# Patient Record
Sex: Female | Born: 2000 | Hispanic: Yes | Marital: Single | Smoking: Former smoker
Health system: Southern US, Community
[De-identification: ages and names within clinical notes are randomized; demographics above are authoritative.]

## PROBLEM LIST (undated history)

## (undated) DIAGNOSIS — Z789 Other specified health status: Secondary | ICD-10-CM

## (undated) DIAGNOSIS — Z8759 Personal history of other complications of pregnancy, childbirth and the puerperium: Secondary | ICD-10-CM

## (undated) DIAGNOSIS — R112 Nausea with vomiting, unspecified: Secondary | ICD-10-CM

## (undated) HISTORY — DX: Personal history of other complications of pregnancy, childbirth and the puerperium: Z87.59

## (undated) HISTORY — PX: OTHER SURGICAL HISTORY: SHX169

## (undated) HISTORY — PX: WISDOM TOOTH EXTRACTION: SHX21

## (undated) HISTORY — DX: Other specified health status: Z78.9

## (undated) HISTORY — DX: Nausea with vomiting, unspecified: R11.2

---
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
## 2020-10-26 ENCOUNTER — Other Ambulatory Visit: Payer: Self-pay

## 2020-10-26 ENCOUNTER — Ambulatory Visit (LOCAL_COMMUNITY_HEALTH_CENTER): Payer: Medicaid Other

## 2020-10-26 VITALS — BP 108/73 | Ht 61.0 in | Wt 118.0 lb

## 2020-10-26 DIAGNOSIS — Z3201 Encounter for pregnancy test, result positive: Secondary | ICD-10-CM

## 2020-10-26 LAB — PREGNANCY, URINE: Preg Test, Ur: POSITIVE — AB

## 2020-10-26 MED ORDER — PRENATAL 27-0.8 MG PO TABS
1.0000 | ORAL_TABLET | Freq: Every day | ORAL | 0 refills | Status: AC
Start: 2020-10-26 — End: 2021-02-03

## 2020-10-26 NOTE — Progress Notes (Addendum)
UPT positive. Consult Hazle Coca, CNM regarding hx preterm still birth C-Section delivery at 5 month gestation d/t low amniotic fluid. Provider advises to establish prenatal care ASAP.   Plans prenatal care at [facility]. Provider reports pt may begin prenatal care at [facility], but may need to be referred if becomes high risk.  To clerk for preadmit.Dawn Shepherd, RN   Consulted on the plan of care for this client.  I agree with the documented note and actions taken to provide care for this client.  Hazle Coca, CNM

## 2020-11-05 ENCOUNTER — Ambulatory Visit: Payer: Medicaid Other | Admitting: Family Medicine

## 2020-11-05 ENCOUNTER — Encounter: Payer: Self-pay | Admitting: Family Medicine

## 2020-11-05 ENCOUNTER — Other Ambulatory Visit: Payer: Self-pay

## 2020-11-05 VITALS — BP 107/76 | HR 106 | Temp 98.6°F | Wt 120.4 lb

## 2020-11-05 DIAGNOSIS — O34219 Maternal care for unspecified type scar from previous cesarean delivery: Secondary | ICD-10-CM

## 2020-11-05 DIAGNOSIS — Z23 Encounter for immunization: Secondary | ICD-10-CM

## 2020-11-05 DIAGNOSIS — O0990 Supervision of high risk pregnancy, unspecified, unspecified trimester: Secondary | ICD-10-CM | POA: Diagnosis not present

## 2020-11-05 DIAGNOSIS — O09899 Supervision of other high risk pregnancies, unspecified trimester: Secondary | ICD-10-CM | POA: Insufficient documentation

## 2020-11-05 LAB — HEMOGLOBIN, FINGERSTICK: Hemoglobin: 12.7 g/dL (ref 11.1–15.9)

## 2020-11-05 LAB — URINALYSIS
Bilirubin, UA: NEGATIVE
Glucose, UA: NEGATIVE
Ketones, UA: NEGATIVE
Nitrite, UA: POSITIVE — AB
Protein,UA: NEGATIVE
Specific Gravity, UA: 1.03 (ref 1.005–1.030)
Urobilinogen, Ur: 0.2 mg/dL (ref 0.2–1.0)
pH, UA: 6 (ref 5.0–7.5)

## 2020-11-05 MED ORDER — HYDROXYPROGESTERONE CAPROATE 275 MG/1.1ML ~~LOC~~ SOAJ: 275.0000 mg | SUBCUTANEOUS | Status: DC

## 2020-11-05 NOTE — Progress Notes (Signed)
[facility] HEALTH DEPT [facility] [address] [place] [address] [phone]  INITIAL PRENATAL VISIT NOTE  Subjective:  Dawn Schaefer is a 20 y.o. G2P0100 at [redacted]w[redacted]d being seen today to start prenatal care at the [facility] Department.  She is currently monitored for the following issues for this high-risk pregnancy and has Supervision of high risk pregnancy, antepartum; History of cesarean delivery, currently pregnant; and H/O preterm delivery, currently pregnant on their problem list.  Patient reports no complaints.  Contractions: Not present. Vag. Bleeding: None.  Movement: Absent. Denies leaking of fluid.   This is desired pregnancy and FOB involved and excited. The patient reports having bleeding at 5 months of pregnancy and delivered by C-section due to "no fluid" around the baby. She has a horizontal skin incision and does not remember being told if she can labor or not. Infant died several days after delivery.   Indications for ASA therapy (per uptodate) One of the following: Previous pregnancy with preeclampsia, especially early onset and with an adverse outcome No Multifetal gestation No Chronic hypertension No Type 1 or 2 diabetes mellitus No Chronic kidney disease No Autoimmune disease (antiphospholipid syndrome, systemic lupus erythematosus) No  Two or more of the following: Nulliparity No Obesity (body mass index >30 kg/m2) No Family history of preeclampsia in mother or sister No Age ?35 years No Sociodemographic characteristics (African American race, low socioeconomic level) No Personal risk factors (eg, previous pregnancy with low birth weight or small for gestational age infant, previous adverse pregnancy outcome [eg, stillbirth], interval >10 years between pregnancies) No   The following portions of the patient's history were reviewed and updated as appropriate: allergies, current  medications, past family history, past medical history, past social history, past surgical history and problem list. Problem list updated.  Objective:   Vitals:   11/05/20 1342  BP: 107/76  Pulse: (!) 106  Temp: 98.6 F (37 C)  Weight: 120 lb 6.4 oz (54.6 kg)    Fetal Status: Fetal Heart Rate (bpm): 154 Fundal Height: 14 cm Movement: Absent     Physical Exam Vitals and nursing note reviewed.  Constitutional:      General: She is not in acute distress.    Appearance: Normal appearance. She is well-developed.  HENT:     Head: Normocephalic and atraumatic.     Right Ear: External ear normal.     Left Ear: External ear normal.     Nose: Nose normal. No congestion or rhinorrhea.     Mouth/Throat:     Lips: Pink.     Mouth: Mucous membranes are moist.     Dentition: Normal dentition. No dental caries.     Pharynx: Oropharynx is clear. Uvula midline.     Comments: Dentition: normal, no cavities.  Eyes:     General: No scleral icterus.    Conjunctiva/sclera: Conjunctivae normal.  Neck:     Thyroid: No thyroid mass or thyromegaly.  Cardiovascular:     Rate and Rhythm: Normal rate.     Pulses: Normal pulses.     Comments: Extremities are warm and well perfused Pulmonary:     Effort: Pulmonary effort is normal.     Breath sounds: Normal breath sounds.  Chest:     Chest wall: No mass.  Breasts:     Tanner Score is 5. Breasts are symmetrical.     Right: Normal. No mass, nipple discharge, skin change or axillary adenopathy.  Left: Normal. No mass, nipple discharge, skin change or axillary adenopathy.    Abdominal:     General: Abdomen is flat.     Palpations: Abdomen is soft.     Tenderness: There is no abdominal tenderness.     Comments: Gravid   Genitourinary:    General: Normal vulva.     Exam position: Lithotomy position.     Pubic Area: No rash.      Labia:        Right: No rash.        Left: No rash.      Vagina: Normal. No vaginal discharge.     Cervix: No  cervical motion tenderness or friability.     Uterus: Normal. Enlarged (Gravid 14wk). Not tender.      Adnexa: Right adnexa normal and left adnexa normal.     Rectum: Normal. No external hemorrhoid.  Musculoskeletal:     Right lower leg: No edema.     Left lower leg: No edema.  Lymphadenopathy:     Cervical: No cervical adenopathy.     Upper Body:     Right upper body: No axillary adenopathy.     Left upper body: No axillary adenopathy.  Skin:    General: Skin is warm.     Capillary Refill: Capillary refill takes less than 2 seconds.  Neurological:     Mental Status: She is alert.     Assessment and Plan:  Pregnancy: G2P0100 at [redacted]w[redacted]d  1. Supervision of high risk pregnancy, antepartum Reviewed pregnancy care at [facility] Discussed cadence of visits Desires genetic screening-- referral to genetic counseling for NIPT. Too late for FIRST trimester screening. Desires Quad Has dental home-- [facility] dental clinic. Last appt 2 month ago - Prenatal profile without Varicella/Rubella (267124) - Lead, blood (adult age 65 yrs or greater) - HIV-1/HIV-2 Qualitative RNA - HCV Ab w Reflex to Quant PCR - Urine Culture - Chlamydia/GC NAA, Confirmation - Hemoglobinopathy evaluation -[id number] - [place] MFM OB COMP + 14 WK; Future - HYDROXYprogesterone caproate [facility]) autoinjector; Inject 275 mg into the skin every 7 (seven) days. Weekly injections for 16-36 weeks.  Dispense: 1.05 mL - Hemoglobin, venipuncture - Urinalysis (Urine Dip)  2. H/O preterm delivery, currently pregnant - Discussed 46 with patient given likely preterm birth, she says 5 months but the fact that a CS was done I suspect she was though to be at least 24 weeks since a CS on a previable fetus would be non-standard of care.  She agrees to 17P weekly - [place] MFM OB COMP + 14 WK; Future - HYDROXYprogesterone caproate [facility]) autoinjector; Inject 275 mg into the skin every 7 (seven) days. Weekly injections for 16-36 weeks.  Dispense: 1.05  mL  3. History of cesarean delivery, currently pregnant Unsure of whether this was LTCS or vertical/classical. Given GA of delivery there is a much higher likelihood of classical CS. Patient signed ROI and will try to obtain H&P, Op report and DC summary.    Discussed overview of care and coordination with inpatient delivery practices including [facility], [facility], [facility] and [facility].   Reviewed Centering pregnancy as standard of care at [facility], oriented to room and showed video.    Preterm labor symptoms and general obstetric precautions including but not limited to vaginal bleeding, contractions, leaking of fluid and fetal movement were reviewed in detail with the patient.  Please refer to After Visit Summary for other counseling recommendations.   Return in about 4 weeks (around 12/03/2020)  for Routine prenatal care, in person.  Future Appointments  Date Time Provider Department Center  12/03/2020 10:20 AM [facility] PROVIDER [facility] None    Federico Flake, MD

## 2020-11-05 NOTE — Progress Notes (Signed)
Presents for initiation of prenatal care and taking PNV daily. Denies ED evaluation during pregnancy. Client reports born in [place] and has never traveled internationally. Accepts flu vaccine today, but declines Covid vaccine. Jossie Ng, RN  ROI for [facility] obtained for 05/2019 H & P, delivery note and C-section operative report. Jossie Ng, RN  ROI faxed with fax confirmation received. Urine dip reviewed and 1+ leukocytes / positive nitrites. Urine culture obtained today. Jossie Ng, RN

## 2020-11-06 LAB — CBC/D/PLT+RPR+RH+ABO+AB SCR
Antibody Screen: NEGATIVE
Basophils Absolute: 0.1 10*3/uL (ref 0.0–0.2)
Basos: 1 %
EOS (ABSOLUTE): 0.1 10*3/uL (ref 0.0–0.4)
Eos: 1 %
Hematocrit: 37.9 % (ref 34.0–46.6)
Hemoglobin: 13 g/dL (ref 11.1–15.9)
Hepatitis B Surface Ag: NEGATIVE
Immature Grans (Abs): 0.1 10*3/uL (ref 0.0–0.1)
Immature Granulocytes: 1 %
Lymphocytes Absolute: 1.9 10*3/uL (ref 0.7–3.1)
Lymphs: 18 %
MCH: 31.5 pg (ref 26.6–33.0)
MCHC: 34.3 g/dL (ref 31.5–35.7)
MCV: 92 fL (ref 79–97)
Monocytes Absolute: 0.7 10*3/uL (ref 0.1–0.9)
Monocytes: 6 %
Neutrophils Absolute: 7.9 10*3/uL — ABNORMAL HIGH (ref 1.4–7.0)
Neutrophils: 73 %
Platelets: 242 10*3/uL (ref 150–450)
RBC: 4.13 x10E6/uL (ref 3.77–5.28)
RDW: 13 % (ref 11.7–15.4)
RPR Ser Ql: NONREACTIVE
Rh Factor: POSITIVE
WBC: 10.7 10*3/uL (ref 3.4–10.8)

## 2020-11-06 LAB — HCV AB W REFLEX TO QUANT PCR: HCV Ab: <0.1 s/co ratio (ref 0.0–0.9)

## 2020-11-06 LAB — HCV INTERPRETATION

## 2020-11-06 LAB — LEAD, BLOOD (ADULT >= 16 YRS): Lead-Whole Blood: <1 ug/dL (ref 0–4)

## 2020-11-07 ENCOUNTER — Telehealth: Payer: Self-pay

## 2020-11-07 LAB — URINE CULTURE

## 2020-11-07 LAB — HIV-1/HIV-2 QUALITATIVE RNA
HIV-1 RNA, Qualitative: NONREACTIVE
HIV-2 RNA, Qualitative: NONREACTIVE

## 2020-11-07 LAB — HGB FRACTIONATION CASCADE
Hgb A2: 3 % (ref 1.8–3.2)
Hgb A: 97 % (ref 96.4–98.8)
Hgb F: 0 % (ref 0.0–2.0)
Hgb S: 0 %

## 2020-11-07 LAB — CHLAMYDIA/GC NAA, CONFIRMATION
Chlamydia trachomatis, NAA: NEGATIVE
Neisseria gonorrhoeae, NAA: NEGATIVE

## 2020-11-07 NOTE — Telephone Encounter (Signed)
Encounter opened in error. [name], RN  

## 2020-11-07 NOTE — Telephone Encounter (Signed)
Call to [facility] to verify [place] and genetic counseling appt scheduled. Per scheduler Britta Mccreedy, not aware of referral as does not work off a work que (like Copy). Client info given and [place] appt scheduled for 12/04/2020 at 1100. However, scheduler can't see genetic counseling referral, but RN can see it in referrals on Epic appt desk. Per Britta Mccreedy, she will talk with someone about this. RN will follow-up tomorrow if no response from [place] today.   Call to client with [place] appt of 12/04/20 at 1100 with [facility] Interpreter ID # [id number] and call will not go through. Call to emergency contact number and no available voicemail. Jossie Ng, RN

## 2020-11-08 NOTE — Telephone Encounter (Signed)
Phone call to pt with interpreter Salli Real. Received message about "calling restrictions" and unable to leave message.   Called again from different line at [facility] and got a busy signal. Unable to leave message.

## 2020-11-08 NOTE — Telephone Encounter (Signed)
[facility] [place] 12/04/2020 at 1100 with genetic counselng at 1200 (Referral for genetic counselng faxed today with confirmation received. Call made to obtain appt from [facility]). Call to client with [facility] [employer]pre[employer] ID # [id number] and per recorded message, call cannot be completed as dialed. Call to emergency contact and left message requesting assistance contacting client to call [facility] for [place] appt. Number to call provided. Jossie Ng, RN

## 2020-11-09 ENCOUNTER — Telehealth: Payer: Self-pay | Admitting: Advanced Practice Midwife

## 2020-11-09 ENCOUNTER — Telehealth: Payer: Self-pay

## 2020-11-09 ENCOUNTER — Encounter: Payer: Self-pay | Admitting: Advanced Practice Midwife

## 2020-11-09 ENCOUNTER — Other Ambulatory Visit: Payer: Self-pay | Admitting: Advanced Practice Midwife

## 2020-11-09 DIAGNOSIS — O2340 Unspecified infection of urinary tract in pregnancy, unspecified trimester: Secondary | ICD-10-CM | POA: Insufficient documentation

## 2020-11-09 DIAGNOSIS — O2341 Unspecified infection of urinary tract in pregnancy, first trimester: Secondary | ICD-10-CM

## 2020-11-09 MED ORDER — NITROFURANTOIN MONOHYD MACRO 100 MG PO CAPS: 100.0000 mg | ORAL_CAPSULE | Freq: Two times a day (BID) | ORAL | 0 refills | Status: AC

## 2020-11-09 NOTE — Telephone Encounter (Signed)
Call to client with [facility] MFM [place] and genetic counseling appt. Per recorded message on cell phone, number can't be dialed. Call to emergency contact and per recorded message, voicemail box is not set up. [address] [employer], [place] # [phone] used during calls. Jossie Ng, RN

## 2020-11-09 NOTE — Telephone Encounter (Signed)
T/C via [employer], Toniann Fail #[id number], to pt's home #.Marland KitchenMarland KitchenI have attempted to contact this patient by phone to her home # with the message:  Cannot be completed as dialed. Attempted to call pt's mother with the message: voicemail not set up. E-rx'd Macrobid to [facility] pharmacy ([facility] at [address]) for a UTI on 11/05/20 collected by Dr. Alvester Morin. Unable to reach pt at 6:37 pm

## 2020-11-09 NOTE — Telephone Encounter (Signed)
Opened in error. Meyer Arora, RN  

## 2020-11-12 ENCOUNTER — Telehealth: Payer: Self-pay

## 2020-11-12 NOTE — Telephone Encounter (Signed)
Call to client as 1) needs UTI treatment - see result note, 2) needs [place] / genetic counseling appt date and 3) needs to present to clinic to sign paperwork for 17P. Per [employer], ID # [id number], recorded message states number can't be dialed. Jossie Ng, RN

## 2020-11-12 NOTE — Telephone Encounter (Signed)
Encounter signed. Refer to phone encounter opened 11/12/2020. Jossie Ng, RN

## 2020-11-12 NOTE — Telephone Encounter (Signed)
Call to client with [facility] Interpreter ID # [id number] to notify her of 1) need for UTI treatment (see result note) and 2) [place] and genetic counseling appt. Per recorded message, voicemail is not set up on either client or emergency contact phone. Jossie Ng, RN

## 2020-11-13 NOTE — Telephone Encounter (Signed)
Phone call to [phone] x [phone]. Only received busy signal, no ringing, no voicemail.  Interpreter called from her private number and received message about "calling restrictions."  Unable to leave message.

## 2020-11-13 NOTE — Telephone Encounter (Signed)
  Phone call to [phone]. Only received busy signal, no ringing, no voicemail.  Unable to leave message.

## 2020-11-14 ENCOUNTER — Telehealth: Payer: Self-pay

## 2020-11-14 NOTE — Telephone Encounter (Signed)
Call to [facility] Medical Records to ascertain status of ROI  (confirmation received) on 11/05/2020. Per record clerk, unable to locate ROI. ROI refaxed with confirmation received. Jossie Ng, RN

## 2020-11-14 NOTE — Telephone Encounter (Signed)
Phone call to [phone]. Only received busy signal, no ringing, no voicemail. Unable to leave message.

## 2020-11-15 NOTE — Telephone Encounter (Signed)
In response to RN staff message from yesterday, R. Marlan Palau MSW, OBCM able to locate additional number for client. Number previously called incorrect and new number added to demographic screen. Call to client with [facility] and counseled regarding the following: 1) UTI and need to retrieve antibiotic today from pharmacy and begin taking, 2) [place] and genetic counseling appt given and 3) appt to initiate 17P scheduled. Client able to correctly verbalize above information and states understanding of above. Jossie Ng, RN

## 2020-11-15 NOTE — Telephone Encounter (Signed)
Phone call to pt with interpreter Marlene Yemen.  Unable to leave message.

## 2020-11-20 ENCOUNTER — Other Ambulatory Visit: Payer: Self-pay | Admitting: Family Medicine

## 2020-11-20 ENCOUNTER — Other Ambulatory Visit: Payer: Self-pay

## 2020-11-20 ENCOUNTER — Other Ambulatory Visit: Payer: Medicaid Other

## 2020-11-20 DIAGNOSIS — O09899 Supervision of other high risk pregnancies, unspecified trimester: Secondary | ICD-10-CM

## 2020-11-20 DIAGNOSIS — O09212 Supervision of pregnancy with history of pre-term labor, second trimester: Secondary | ICD-10-CM | POA: Diagnosis not present

## 2020-11-20 MED ORDER — HYDROXYPROGESTERONE CAPROATE 250 MG/ML IM OIL: 250.0000 mg | TOPICAL_OIL | INTRAMUSCULAR | Status: DC

## 2020-11-20 MED ORDER — HYDROXYPROGESTERONE CAPROATE 250 MG/ML IM OIL: 275.0000 mg | TOPICAL_OIL | INTRAMUSCULAR | Status: DC

## 2020-11-20 MED ORDER — HYDROXYPROGESTERONE CAPROATE 275 MG/1.1ML ~~LOC~~ SOAJ
275.0000 mg | Freq: Once | SUBCUTANEOUS | Status: AC
  Administered 2020-11-20: 275 mg via SUBCUTANEOUS

## 2020-11-20 NOTE — Progress Notes (Signed)
Pt in Nurse Clinic to start 17 P. 17 P consent reviewed and signed. Denies signs of preterm labor. 17 P administered subcutaneous L arm per order by K. Alvester Morin, MD dated 11/05/2020. Tolerated well. Pt escorted to [facility] clinic and advised to schedule next week's 17 P visit. Jerel Shepherd, RN

## 2020-11-21 ENCOUNTER — Telehealth: Payer: Self-pay

## 2020-11-21 NOTE — Telephone Encounter (Signed)
11/05/2020 ROI faxed for 05/2019 H & P, Delivery Note and C-section operative report with fax confirmation received. Phone call 11/14/2020 to ascertain status of records and requested to refax ROI. This was done and fax confirmation received. Call to [facility] today as records not yet received (not in [facility], clerical, chart room or admin area). Per medical records, 11 pages faxed on 11/15/2020 to Jossie Ng at fax # on request (which is [facility] fax). As unable to locate records, requested they be sent again and RN was told would be done as soon as phone call ended. At 1655, call again to medical records and per recorded message, office is closed. RN will attempt another contact tomorrow. Jossie Ng, RN

## 2020-11-22 NOTE — Telephone Encounter (Signed)
Call to Medical Records at [facility] and spoke with Patty. Requested records not received yesterday pm. Per Alexia Freestone, will re-fax records and RN instructed to notify her if not received by 1400. As RN documenting this note, records received. Jossie Ng, RN

## 2020-11-27 ENCOUNTER — Ambulatory Visit: Payer: Medicaid Other

## 2020-11-27 ENCOUNTER — Telehealth: Payer: Self-pay

## 2020-11-27 ENCOUNTER — Encounter: Payer: Self-pay | Admitting: Family Medicine

## 2020-11-27 NOTE — Telephone Encounter (Signed)
[facility] as scheduled at 0820 in [facility] 11/27/2020.  Client with [facility] IP appt few weeks ago and per Dr. Alvester Morin note, follow-up appt due in 4 weeks around 12/03/2020 (scheduled).  Client initiated 17P on 11/20/2020. !7P due 11/27/2020 and appt scheduled in [facility], not Nurse Clinic. Client needs 17P 11/27/2020 and to keep 12/03/20 [facility] RV appt /17P. Call to client and left message to call regarding [facility] appts. Number to call provided. [employer] ID # [id number] used during call. Jossie Ng, RN

## 2020-11-28 NOTE — Telephone Encounter (Signed)
Phone call to pt with interpreter Marlene Yemen. Pt states she does not need an interpreter.  Proceeded phone call in English, no interpreter.  Pt states she can not come today for the 17P.  Pt scheduled for 17P on 11/29/20 at pt's request.

## 2020-11-29 ENCOUNTER — Telehealth: Payer: Self-pay

## 2020-11-29 ENCOUNTER — Other Ambulatory Visit: Payer: Self-pay

## 2020-11-29 NOTE — Telephone Encounter (Signed)
TC to patient to reschedule missed appointment for 17-P. Patient states she cannot come at all today, and is scheduled tomorrow in [facility] clinic. Supervisor K. Rudd aware that patient is booked during afternoon meeting.Burt Knack, RN

## 2020-11-30 ENCOUNTER — Telehealth: Payer: Self-pay | Admitting: Student

## 2020-11-30 ENCOUNTER — Encounter: Payer: Self-pay | Admitting: Student

## 2020-11-30 NOTE — Telephone Encounter (Addendum)
TC to patient re missed 17-P apt. Pt states that she has no transportation today. Pt states on other days that she had "other things to do."  Patient states that she doesn't work. This RN counseled patient that 17-P injections are to prevent preterm labor, and that she would have to commit to receiving the injection once a week until [redacted] weeks GA. Pt states that she will come some time this afternoon for this injection. This RN explained to patient that she may speak to the provider about creating a plan of care, in case she does not want to receive 17-P injections. Sharlyne Pacas, RN   Patient did not show up today before 4:30 pm to receive initial 17-P injection. Sharlyne Pacas, RN

## 2020-12-03 ENCOUNTER — Other Ambulatory Visit: Payer: Self-pay

## 2020-12-03 ENCOUNTER — Ambulatory Visit: Payer: Medicaid Other | Admitting: Family Medicine

## 2020-12-03 VITALS — BP 101/66 | HR 87 | Temp 98.1°F | Wt 126.2 lb

## 2020-12-03 DIAGNOSIS — O09899 Supervision of other high risk pregnancies, unspecified trimester: Secondary | ICD-10-CM

## 2020-12-03 DIAGNOSIS — O34219 Maternal care for unspecified type scar from previous cesarean delivery: Secondary | ICD-10-CM

## 2020-12-03 DIAGNOSIS — O09212 Supervision of pregnancy with history of pre-term labor, second trimester: Secondary | ICD-10-CM

## 2020-12-03 DIAGNOSIS — O0992 Supervision of high risk pregnancy, unspecified, second trimester: Secondary | ICD-10-CM

## 2020-12-03 DIAGNOSIS — O0990 Supervision of high risk pregnancy, unspecified, unspecified trimester: Secondary | ICD-10-CM

## 2020-12-03 DIAGNOSIS — O2342 Unspecified infection of urinary tract in pregnancy, second trimester: Secondary | ICD-10-CM

## 2020-12-03 MED ORDER — HYDROXYPROGESTERONE CAPROATE 275 MG/1.1ML ~~LOC~~ SOAJ
275.0000 mg | SUBCUTANEOUS | Status: DC
  Administered 2020-12-03 – 2021-04-08 (×18): 275 mg via SUBCUTANEOUS

## 2020-12-03 NOTE — Progress Notes (Signed)
  PRENATAL VISIT NOTE  Subjective:  Dawn Schaefer is a 20 y.o. G2P0100 at [redacted]w[redacted]d being seen today for ongoing prenatal care.  She is currently monitored for the following issues for this high-risk pregnancy and has Supervision of high risk pregnancy, antepartum; History of Classical cesarean delivery, currently pregnant; H/O preterm delivery, currently pregnant; and UTI (urinary tract infection) during pregnancy 11/05/20 >100,000 E. coli on their problem list.  Patient reports no complaints.  Contractions: Not present. Vag. Bleeding: None.  Movement: Present. Denies leaking of fluid/ROM.   The following portions of the patient's history were reviewed and updated as appropriate: allergies, current medications, past family history, past medical history, past social history, past surgical history and problem list. Problem list updated.  Objective:   Vitals:   12/03/20 1036  BP: 101/66  Pulse: 87  Temp: 98.1 F (36.7 C)  Weight: 126 lb 3.2 oz (57.2 kg)    Fetal Status: Fetal Heart Rate (bpm): 147 Fundal Height: 18 cm Movement: Present     General:  Alert, oriented and cooperative. Patient is in no acute distress.  Skin: Skin is warm and dry. No rash noted.   Cardiovascular: Normal heart rate noted  Respiratory: Normal respiratory effort, no problems with respiration noted  Abdomen: Soft, gravid, appropriate for gestational age.  Pain/Pressure: Absent     Pelvic: Cervical exam deferred        Extremities: Normal range of motion.     Mental Status: Normal mood and affect. Normal behavior. Normal judgment and thought content.   Assessment and Plan:  Pregnancy: G2P0100 at [redacted]w[redacted]d  1. H/O preterm delivery, currently pregnant 17 started today Referral to OBCM made to assure involvement with care d/t preterm birth and transportation issues  2. Urinary tract infection in mother during second trimester of pregnancy - completed abx - Urine Culture today   3. Supervision of high  risk pregnancy, antepartum Up to date Starting 17 P today Declined QUAD- plans on NIPT tomorrow Anatomy Scan tomorrow (3/29) as well  4. History of Classical cesarean delivery, currently pregnant Discussed with patient that she is NOT eligible for TOLAC Informed that we recommend RCS at 37 weeks Plan for delivery plans appt referral at 30-32 weeks to meet with surgical team from [facility]  Preterm labor symptoms and general obstetric precautions including but not limited to vaginal bleeding, contractions, leaking of fluid and fetal movement were reviewed in detail with the patient.  Please refer to After Visit Summary for other counseling recommendations.   Return in about 1 week (around 12/10/2020) for 17 P weekly .  Future Appointments  Date Time Provider Department Center  12/04/2020 11:00 AM [facility] US1 [facility]  12/04/2020 12:00 PM [facility] GENETIC RM [facility] None  12/10/2020 10:00 AM [facility] NURSE [facility] None  12/31/2020 10:00 AM [facility] PROVIDER [facility] None    Federico Flake, MD

## 2020-12-03 NOTE — Progress Notes (Signed)
17-P given today, tolerated well, right arm. Patient scheduled for next RV and next 17-P.Marland KitchenBurt Knack, RN

## 2020-12-03 NOTE — Telephone Encounter (Signed)
Client has [facility] RV / 17P appt 12/03/2020. Jossie Ng, RN

## 2020-12-03 NOTE — Progress Notes (Signed)
Patient here for [facility] RV at 18 weeks. Missed 17-P appointment last Thursday and Friday, due to ride issue. States that from now on, she will be able to get a ride for her appointments. Phone number, emergency contact and pharmacy verified. Needs urine TOC today.Burt Knack, RN

## 2020-12-04 ENCOUNTER — Ambulatory Visit (HOSPITAL_BASED_OUTPATIENT_CLINIC_OR_DEPARTMENT_OTHER): Payer: Medicaid Other

## 2020-12-04 ENCOUNTER — Other Ambulatory Visit: Payer: Self-pay

## 2020-12-04 ENCOUNTER — Other Ambulatory Visit
Admission: RE | Admit: 2020-12-04 | Discharge: 2020-12-04 | Disposition: A | Discharge status: Home / Self Care | Payer: Medicaid Other | Source: Ambulatory Visit | Attending: Maternal & Fetal Medicine | Admitting: Maternal & Fetal Medicine

## 2020-12-04 ENCOUNTER — Ambulatory Visit: Payer: Medicaid Other

## 2020-12-04 DIAGNOSIS — Z36 Encounter for antenatal screening for chromosomal anomalies: Secondary | ICD-10-CM | POA: Diagnosis present

## 2020-12-04 DIAGNOSIS — O09219 Supervision of pregnancy with history of pre-term labor, unspecified trimester: Secondary | ICD-10-CM | POA: Diagnosis not present

## 2020-12-04 DIAGNOSIS — O34219 Maternal care for unspecified type scar from previous cesarean delivery: Secondary | ICD-10-CM | POA: Diagnosis not present

## 2020-12-04 DIAGNOSIS — O09899 Supervision of other high risk pregnancies, unspecified trimester: Secondary | ICD-10-CM

## 2020-12-04 DIAGNOSIS — Z315 Encounter for genetic counseling: Secondary | ICD-10-CM

## 2020-12-04 DIAGNOSIS — O09212 Supervision of pregnancy with history of pre-term labor, second trimester: Secondary | ICD-10-CM | POA: Insufficient documentation

## 2020-12-04 DIAGNOSIS — O321XX0 Maternal care for breech presentation, not applicable or unspecified: Secondary | ICD-10-CM

## 2020-12-04 DIAGNOSIS — O09292 Supervision of pregnancy with other poor reproductive or obstetric history, second trimester: Secondary | ICD-10-CM | POA: Insufficient documentation

## 2020-12-04 DIAGNOSIS — Z8759 Personal history of other complications of pregnancy, childbirth and the puerperium: Secondary | ICD-10-CM | POA: Diagnosis not present

## 2020-12-04 DIAGNOSIS — Z3A18 18 weeks gestation of pregnancy: Secondary | ICD-10-CM

## 2020-12-04 DIAGNOSIS — O0990 Supervision of high risk pregnancy, unspecified, unspecified trimester: Secondary | ICD-10-CM

## 2020-12-04 NOTE — Progress Notes (Signed)
Dawn Schaefer Length of Consultation: 35 minutes   Dawn Schaefer  was referred to [facility] at [facility] for genetic counseling to review prenatal screening and testing options.  This note summarizes the information we discussed.    We offered the following routine screening tests for this pregnancy:  Cell free fetal DNA testing from maternal blood may be used to determine whether a baby is at high risk for Down syndrome, trisomy 58, or trisomy 79.  This test utilizes a maternal blood sample and DNA sequencing technology to isolate circulating cell free fetal DNA from maternal plasma.  The fetal DNA can then be analyzed for DNA sequences that are derived from the three most common chromosomes involved in aneuploidy, chromosomes 13, 18, and 21.  If the overall amount of DNA is greater than the expected level for any of these chromosomes, aneuploidy is suspected.  The detection rate for Down syndrome and trisomy 18 is >99% and the detection rate for trisomy 13 is >91%. While we do not consider it a replacement for invasive testing and karyotype analysis, a negative result from this testing would be reassuring, though not a guarantee of a normal chromosome complement for the baby.  An abnormal result is certainly suggestive of an abnormal chromosome complement, though we would still recommend CVS or amniocentesis to confirm any findings from this testing. This testing can also assess for the sex chromosomes and can detect approximately 96% of sex chromosome aneuploidies and determine fetal gender with >99% confidence.    Maternal serum marker screening, a blood test that measures pregnancy proteins, can provide risk assessments for Down syndrome, trisomy 18, and open neural tube defects (spina bifida, anencephaly). Because it does not directly examine the fetus, it cannot positively diagnose or rule out these problems. If cell free fetal DNA testing is performed, a maternal  serum AFP only is recommended as a means to evaluate for open neural tube defects.  Targeted ultrasound uses high frequency sound waves to create an image of the developing fetus.  An ultrasound is often recommended as a routine means of evaluating the pregnancy.  It is also used to screen for fetal anatomy problems (for example, a heart defect) that might be suggestive of a chromosomal or other abnormality.   Should these screening tests indicate an increased concern, then the following additional testing options would be offered:  Amniocentesis involves the removal of a small amount of amniotic fluid from the sac surrounding the fetus with the use of a thin needle inserted through the maternal abdomen and uterus.  Ultrasound guidance is used throughout the procedure.  Fetal cells from amniotic fluid are directly evaluated and > 99.5% of chromosome problems and > 98% of open neural tube defects can be detected. This procedure is generally performed after the 15th week of pregnancy.  The main risks to this procedure include complications leading to miscarriage in less than 1 in 200 cases (0.5%).  Cystic Fibrosis and Spinal Muscular Atrophy (SMA) screening were also discussed with the patient. Both conditions are recessive, which means that both parents must be carriers in order to have a child with the disease.  Cystic fibrosis (CF) is one of the most common genetic conditions in persons of Caucasian ancestry.  This condition occurs in approximately 1 in 2,500 Caucasian persons and results in thickened secretions in the lungs, digestive, and reproductive systems.  For a baby to be at risk for having CF, both of the parents must  be carriers for this condition.  Approximately 1 in 56 Caucasian persons is a carrier for CF.  Current carrier testing looks for the most common mutations in the gene for CF and can detect approximately 90% of carriers in the Caucasian population.  This means that the carrier  screening can greatly reduce, but cannot eliminate, the chance for an individual to have a child with CF.  If an individual is found to be a carrier for CF, then carrier testing would be available for the partner. As part of [place]'s newborn screening profile, all babies born in the state of [place] will have a two-tier screening process.  Specimens are first tested to determine the concentration of immunoreactive trypsinogen (IRT).  The top 5% of specimens with the highest IRT values then undergo DNA testing using a panel of over 40 common CF mutations. SMA is a neurodegenerative disorder that leads to atrophy of skeletal muscle and overall weakness.  This condition is also more prevalent in the Caucasian population, with 1 in 40-1 in 60 persons being a carrier and 1 in 6,000-1 in 10,000 children being affected.  There are multiple forms of the disease, with some causing death in infancy to other forms with survival into adulthood.  The genetics of SMA is complex, but carrier screening can detect up to 95% of carriers in the Caucasian population.  Similar to CF, a negative result can greatly reduce, but cannot eliminate, the chance to have a child with SMA. Hemoglobinopathy screening was previously performed by her OB office and was normal (AA, MCV 92). The patient and her partner are of [place] ancestry.  We obtained a detailed family history and pregnancy history.  The family history was reported to be unremarkable for birth defects, intellectual delays, recurrent pregnancy loss or known chromosome abnormalities.  Dawn Schaefer stated that this is her second pregnancy, the first with her current partner.  Her first pregnancy ended in preterm delivery by c-section at 24 weeks and the baby passed away. During that pregnancy the patient stated that she had an abnormal screening test for spina bifida and an amniocentesis, though she does not recall results of that testing. Per her report, no  birth defects were noted and no autopsy was performed. Without additional medical information, it is difficult to determine if there is any increased risk for birth defects in this pregnancy. We are happy to review medical records if desired regarding this history.  She reported no complications or exposure to medications, alcohol, tobacco or recreational drugs in the current pregnancy.  After consideration of the options, Dawn Schaefer elected to decline aneuploidy screening but elected to have carrier testing for CF and SMA.  An ultrasound was performed at the time of the visit.  The gestational age was consistent with 18 weeks.  Fetal anatomy was seen and appeared normal, though the spine was suboptimally visualized.  The patient was scheduled to return to our clinic for a follow up ultrasound in 4 weeks.  Please refer to the ultrasound report for details of that study.  Dawn Schaefer was encouraged to call with questions or concerns.  We can be contacted at [phone].  Plan of care: . CF/SMA carrier screening drawn today . Declined cell free fetal DNA testing and AFP only . Return in 4 weeks for f/u ultrasound   Labs ordered: Inheritest CF/SMA  Cherly Anderson, MS, CGC

## 2020-12-05 LAB — URINE CULTURE: Organism ID, Bacteria: NO GROWTH

## 2020-12-10 ENCOUNTER — Other Ambulatory Visit: Payer: Self-pay

## 2020-12-11 ENCOUNTER — Other Ambulatory Visit: Payer: Self-pay

## 2020-12-11 ENCOUNTER — Other Ambulatory Visit: Payer: Medicaid Other

## 2020-12-11 DIAGNOSIS — O0992 Supervision of high risk pregnancy, unspecified, second trimester: Secondary | ICD-10-CM

## 2020-12-11 DIAGNOSIS — Z8751 Personal history of pre-term labor: Secondary | ICD-10-CM | POA: Diagnosis not present

## 2020-12-11 DIAGNOSIS — O09899 Supervision of other high risk pregnancies, unspecified trimester: Secondary | ICD-10-CM

## 2020-12-11 NOTE — Progress Notes (Signed)
In Nurse Clinic for 17 P. Denies symptoms of preterm labor. 17 P (Makena auto injector) administered per order K. Alvester Morin, MD dated 12/03/2020. Tolerated well L arm. Pt to clerk to schedule 17 P each week for next 2 weeks. Jerel Shepherd, RN

## 2020-12-14 LAB — MISC LABCORP TEST (SEND OUT): Labcorp test code: 452172

## 2020-12-18 ENCOUNTER — Telehealth: Payer: Self-pay

## 2020-12-18 ENCOUNTER — Telehealth: Payer: Self-pay | Admitting: Obstetrics and Gynecology

## 2020-12-18 ENCOUNTER — Other Ambulatory Visit: Payer: Medicaid Other

## 2020-12-18 NOTE — Telephone Encounter (Signed)
  We informed Ms. Dawn Schaefer that the results of the recent screening test for Cystic fibrosis (CF) and Spinal Muscular Atrophy (SMA) are now available.    CF is a genetic condition that occurs most often in Caucasian persons.  It primarily affects the lungs, digestive, and reproductive systems.  For someone to be at risk for having CF, both of their parents must be carriers for CF.  The testing can detect many persons who are carriers for CF and therefore determine if the pregnancy is at an increased risk for this condition.    The blood test results were negative when examined for the 97 most common mutations (or changes) in the gene for CF.  This means that she does not carry any of the most common changes in this gene.  Testing for these 97 mutations detects approximately 92.6% of carriers who are Hispanic.  Therefore, the chance that she is a carrier based on this negative result has been reduced from 1 in 68 to approximately 1 in 906.  Because this testing cannot detect all changes that may cause CF, we cannot eliminate the chance that this individual is a carrier completely.  The results of the SMA carrier screening are also available.  SMA is also a recessive genetic condition with variable age of onset and severity caused by mutations in the SMN1 gene.  This carrier testing assesses the number of copies of this gene.  Persons with one copy of the SMN1 gene are carriers, and those with no copies are affected with the condition.  Individuals with two or more copies have a reduced chance to be a carrier.  Not all mutations can be detected with this testing, though it can detect 78% of carriers in the Hispanic population.  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 *3+80T>G SNP, thus reducing her chance to be a carrier from 1 in 61 to 1 in 260.  Again, this testing cannot eliminate the chance to have a child with SMA, but dramatically reduces  the chance.    We encouraged the patient to call with any questions or concerns as they arise. No additional testing is recommended for these conditions.  We may be reached at [phone].  Cherly Anderson, MS, CGC

## 2020-12-18 NOTE — Telephone Encounter (Signed)
[facility] as scheduled in Nurse Clinic 12/18/2020 for 17-P injection. Call to client and left message requesting she re-schedule appt and to ask to speak to [facility] RN if has questions. Number to call provided. Jossie Ng, RN

## 2020-12-19 NOTE — Telephone Encounter (Signed)
Call to client to reschedule missed 17P appt. Appt rescheduled for 12/20/2020 and next 17P appt scheduled for 12/27/2020. Client aware of [facility] RV appt on 12/31/2020. Client stated one week after receives injection it starts itching at injection site. Denies any other symptoms and states does not leave band-aid on site during week after injection. Client states will try one more injection and if itching occurs will probably stop medicine. Encouraged discussion with provider prior to stopping 17P and given number to call to contact clinic. Jossie Ng, RN

## 2020-12-20 ENCOUNTER — Other Ambulatory Visit: Payer: Self-pay

## 2020-12-20 ENCOUNTER — Other Ambulatory Visit: Payer: Medicaid Other

## 2020-12-20 DIAGNOSIS — O09212 Supervision of pregnancy with history of pre-term labor, second trimester: Secondary | ICD-10-CM

## 2020-12-20 DIAGNOSIS — O0992 Supervision of high risk pregnancy, unspecified, second trimester: Secondary | ICD-10-CM

## 2020-12-20 DIAGNOSIS — O09899 Supervision of other high risk pregnancies, unspecified trimester: Secondary | ICD-10-CM

## 2020-12-20 NOTE — Progress Notes (Signed)
In Nurse Clinic for 17P. Reports itching at 17P injection site for past week and using topical hydrocortisone cream at site. Consult Dr. Ralene Bathe who states ok for pt to continue to use hydrocortisone cream at injection site for itching. 17 P (Makena autoinjector) given today R arm per order Dr. Kirtland Bouchard. [place] dated 12/03/2020. Tolerated well. Bandaid not applied at injection site today. To call with problems/concerns. Next 17P scheduled 12/27/2020 and pt has appt. Jerel Shepherd, RN

## 2020-12-22 ENCOUNTER — Other Ambulatory Visit: Payer: Self-pay | Admitting: Family Medicine

## 2020-12-22 DIAGNOSIS — Z98891 History of uterine scar from previous surgery: Secondary | ICD-10-CM

## 2020-12-22 DIAGNOSIS — O09892 Supervision of other high risk pregnancies, second trimester: Secondary | ICD-10-CM

## 2020-12-24 ENCOUNTER — Other Ambulatory Visit: Payer: Medicaid Other

## 2020-12-24 NOTE — Progress Notes (Signed)
Attestation of Attending Supervision of clinical support staff: I agree with the care provided to this patient and was available for any consultation.  I have reviewed the RN's note and chart. I was available for consult and to see the patient if needed.   Federico Flake, MD, MPH, ABFM

## 2020-12-27 ENCOUNTER — Other Ambulatory Visit: Payer: Self-pay

## 2020-12-31 ENCOUNTER — Other Ambulatory Visit: Payer: Self-pay

## 2020-12-31 ENCOUNTER — Ambulatory Visit: Payer: Medicaid Other | Admitting: Advanced Practice Midwife

## 2020-12-31 ENCOUNTER — Other Ambulatory Visit: Payer: Medicaid Other

## 2020-12-31 VITALS — BP 110/66 | HR 90 | Temp 98.3°F | Wt 134.0 lb

## 2020-12-31 DIAGNOSIS — O0992 Supervision of high risk pregnancy, unspecified, second trimester: Secondary | ICD-10-CM

## 2020-12-31 DIAGNOSIS — O34219 Maternal care for unspecified type scar from previous cesarean delivery: Secondary | ICD-10-CM

## 2020-12-31 DIAGNOSIS — O09899 Supervision of other high risk pregnancies, unspecified trimester: Secondary | ICD-10-CM

## 2020-12-31 DIAGNOSIS — O0930 Supervision of pregnancy with insufficient antenatal care, unspecified trimester: Secondary | ICD-10-CM | POA: Insufficient documentation

## 2020-12-31 DIAGNOSIS — O0990 Supervision of high risk pregnancy, unspecified, unspecified trimester: Secondary | ICD-10-CM

## 2020-12-31 DIAGNOSIS — F1291 Cannabis use, unspecified, in remission: Secondary | ICD-10-CM | POA: Insufficient documentation

## 2020-12-31 DIAGNOSIS — Z8751 Personal history of pre-term labor: Secondary | ICD-10-CM

## 2020-12-31 DIAGNOSIS — Z87898 Personal history of other specified conditions: Secondary | ICD-10-CM

## 2020-12-31 LAB — URINALYSIS
Bilirubin, UA: NEGATIVE
Glucose, UA: NEGATIVE
Ketones, UA: NEGATIVE
Leukocytes,UA: NEGATIVE
Nitrite, UA: NEGATIVE
Protein,UA: NEGATIVE
RBC, UA: NEGATIVE
Specific Gravity, UA: 1.03 (ref 1.005–1.030)
Urobilinogen, Ur: 0.2 mg/dL (ref 0.2–1.0)
pH, UA: 6.5 (ref 5.0–7.5)

## 2020-12-31 NOTE — Progress Notes (Signed)
17-P given left arm, no bandaid applied per patient request. Patient scheduled for 17-P appointments on Tuesdays (01/08/21, 01/15/21, 01/22/21 and MH RV with 17-P on 01/29/21). Patient states Tuesday is usually a good day and states she'll call to reschedule if she can't make one of her appointments. Patient counseled that 17-P works best if given weekly. Patient states understanding. Urine dip reviewed by provider, no new orders.Burt Knack, RN

## 2020-12-31 NOTE — Progress Notes (Signed)
Patient here for MH RV at 22 wks. Aware of U/S on 01/03/2021. Desires to continue with 17-P despite the itching. Burt Knack, RN

## 2020-12-31 NOTE — Progress Notes (Signed)
   PRENATAL VISIT NOTE  Subjective:  Dawn Schaefer is a 20 y.o. G2P0100 at [redacted]w[redacted]d being seen today for ongoing prenatal care.  She is currently monitored for the following issues for this high-risk pregnancy and has Supervision of high risk pregnancy, antepartum; History of Classical cesarean delivery, currently pregnant; H/O preterm delivery, currently pregnant; UTI (urinary tract infection) during pregnancy 11/05/20 >100,000 E. 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  Patient reports no complaints.   .  .   . Denies leaking of fluid/ROM.   The following portions of the patient's history were reviewed and updated as appropriate: allergies, current medications, past family history, past medical history, past social history, past surgical history and problem list. Problem list updated.  Objective:   Vitals:   12/31/20 0953  BP: 110/66  Pulse: 90  Temp: 98.3 F (36.8 C)  Weight: 134 lb (60.8 kg)    Fetal Status:   Fundal Height: 24 cm       General:  Alert, oriented and cooperative. Patient is in no acute distress.  Skin: Skin is warm and dry. No rash noted.   Cardiovascular: Normal heart rate noted  Respiratory: Normal respiratory effort, no problems with respiration noted  Abdomen: Soft, gravid, appropriate for gestational age.        Pelvic: Cervical exam deferred        Extremities: Normal range of motion.  Edema: None  Mental Status: Normal mood and affect. Normal behavior. Normal judgment and thought content.   Assessment and Plan:  Pregnancy: G2P0100 at [redacted]w[redacted]d  1. Supervision of high risk pregnancy, antepartum Not working. Feels well. Walking 4x/wk x 30 min. 13 lb (5.897 kg) Reviewed 12/04/20 u/s with genetic counseling where pt declined all aneuplody testing; normal anatomy with suboptimal view of spine, AFI wnl, EFW=53%, anterior placenta at 18 1/7 wks Pt reminded of f/u anatomy u/s  01/03/21 - Urinalysis (Urine Dip) - 161096 Drug Screen  2. History of Classical cesarean delivery, currently pregnant Needs delivery plans apt with [place]  3. H/O preterm delivery, currently pregnant Pt DNKA 17-P last week; c/o itching at injection site 1 week after injections: counseled may use ice to site, may continue hydrocortisone cream prn Had 17-P 12/11/20 and 12/20/20. [facility] 12/27/20. May have today. Counseled pt on importance of having weekly  4. Late prenatal care 14 wks   5. 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 Next court date 01/07/21 Pt agrees to UDS today   Preterm labor symptoms and general obstetric precautions including but not limited to vaginal bleeding, contractions, leaking of fluid and fetal movement were reviewed in detail with the patient. Please refer to After Visit Summary for other counseling recommendations.  No follow-ups on file.  Future Appointments  Date Time Provider Department Center  01/03/2021  2:00 PM [facility]    Alberteen Spindle, CNM

## 2021-01-01 LAB — 789231 7+OXYCODONE-BUND
Amphetamines, Urine: NEGATIVE ng/mL
BENZODIAZ UR QL: NEGATIVE ng/mL
Barbiturate screen, urine: NEGATIVE ng/mL
Cannabinoid Quant, Ur: NEGATIVE ng/mL
Cocaine (Metab.): NEGATIVE ng/mL
OPIATE SCREEN URINE: NEGATIVE ng/mL
Oxycodone/Oxymorphone, Urine: NEGATIVE ng/mL
PCP Quant, Ur: NEGATIVE ng/mL

## 2021-01-03 ENCOUNTER — Ambulatory Visit: Payer: Medicaid Other | Attending: Maternal & Fetal Medicine

## 2021-01-03 ENCOUNTER — Other Ambulatory Visit: Payer: Self-pay

## 2021-01-03 DIAGNOSIS — O34219 Maternal care for unspecified type scar from previous cesarean delivery: Secondary | ICD-10-CM | POA: Diagnosis not present

## 2021-01-03 DIAGNOSIS — Z3A22 22 weeks gestation of pregnancy: Secondary | ICD-10-CM | POA: Diagnosis not present

## 2021-01-03 DIAGNOSIS — O09892 Supervision of other high risk pregnancies, second trimester: Secondary | ICD-10-CM

## 2021-01-03 DIAGNOSIS — Z98891 History of uterine scar from previous surgery: Secondary | ICD-10-CM | POA: Insufficient documentation

## 2021-01-08 ENCOUNTER — Other Ambulatory Visit: Payer: Medicaid Other

## 2021-01-08 ENCOUNTER — Other Ambulatory Visit: Payer: Self-pay

## 2021-01-08 DIAGNOSIS — O09892 Supervision of other high risk pregnancies, second trimester: Secondary | ICD-10-CM

## 2021-01-08 DIAGNOSIS — Z8751 Personal history of pre-term labor: Secondary | ICD-10-CM

## 2021-01-08 DIAGNOSIS — O09899 Supervision of other high risk pregnancies, unspecified trimester: Secondary | ICD-10-CM

## 2021-01-08 NOTE — Progress Notes (Signed)
In Nurse Clinic for 17P. Voices no concerns and denies signs of preterm Labor. 17 P (Makena) administered today R arm per order by K. Alvester Morin, MD dated 12/03/20.  Tolerated well. No bandaid applied to injection site. Has appt for next week (01/15/21) for 17P. Jerel Shepherd, RN

## 2021-01-15 ENCOUNTER — Other Ambulatory Visit: Payer: Self-pay

## 2021-01-15 ENCOUNTER — Other Ambulatory Visit: Payer: Medicaid Other

## 2021-01-15 DIAGNOSIS — Z8751 Personal history of pre-term labor: Secondary | ICD-10-CM

## 2021-01-15 DIAGNOSIS — O09892 Supervision of other high risk pregnancies, second trimester: Secondary | ICD-10-CM

## 2021-01-15 DIAGNOSIS — O09899 Supervision of other high risk pregnancies, unspecified trimester: Secondary | ICD-10-CM

## 2021-01-15 NOTE — Progress Notes (Signed)
In Nurse Clinic for 17 P today. Voices no concerns and denies signs of preterm labor. 17 P (Makena) admin today L arm without problem. 17 P order by K. Alvester Morin, MD dated 12/03/2020. Pt has appt  01/22/2021 for next 17 P. Jerel Shepherd, RN

## 2021-01-20 ENCOUNTER — Other Ambulatory Visit: Payer: Self-pay

## 2021-01-20 ENCOUNTER — Observation Stay
Admission: EM | Admit: 2021-01-20 | Discharge: 2021-01-20 | Disposition: A | Discharge status: Home / Self Care | Payer: Medicaid Other | Attending: Certified Nurse Midwife | Admitting: Certified Nurse Midwife

## 2021-01-20 ENCOUNTER — Encounter: Payer: Self-pay | Admitting: Obstetrics and Gynecology

## 2021-01-20 DIAGNOSIS — O26892 Other specified pregnancy related conditions, second trimester: Secondary | ICD-10-CM | POA: Insufficient documentation

## 2021-01-20 DIAGNOSIS — O34219 Maternal care for unspecified type scar from previous cesarean delivery: Secondary | ICD-10-CM

## 2021-01-20 DIAGNOSIS — Z3A24 24 weeks gestation of pregnancy: Secondary | ICD-10-CM | POA: Diagnosis not present

## 2021-01-20 DIAGNOSIS — O0932 Supervision of pregnancy with insufficient antenatal care, second trimester: Secondary | ICD-10-CM | POA: Diagnosis not present

## 2021-01-20 DIAGNOSIS — R102 Pelvic and perineal pain: Secondary | ICD-10-CM | POA: Insufficient documentation

## 2021-01-20 DIAGNOSIS — O368190 Decreased fetal movements, unspecified trimester, not applicable or unspecified: Secondary | ICD-10-CM | POA: Diagnosis present

## 2021-01-20 DIAGNOSIS — O368121 Decreased fetal movements, second trimester, fetus 1: Secondary | ICD-10-CM | POA: Diagnosis not present

## 2021-01-20 DIAGNOSIS — O0990 Supervision of high risk pregnancy, unspecified, unspecified trimester: Secondary | ICD-10-CM

## 2021-01-20 LAB — FETAL FIBRONECTIN: Fetal Fibronectin: NEGATIVE — AB

## 2021-01-20 MED ORDER — ACETAMINOPHEN 325 MG PO TABS
650.0000 mg | ORAL_TABLET | Freq: Four times a day (QID) | ORAL | Status: DC | PRN
  Administered 2021-01-20: 650 mg via ORAL
  Filled 2021-01-20: qty 2

## 2021-01-20 NOTE — Discharge Summary (Signed)
Patient ID: Dawn Schaefer MRN: [id number] DOB/AGE: [birth year] 20 y.o.  Admit date: 01/20/2021 Discharge date: 01/20/2021  Admission Diagnoses: 20yo G2P0 at [redacted]w[redacted]d with c/o decreased FM and lower pelvic pain. She is a high-risk pregnancy getting her prenatal care at [facility].  Factors complicating pregnancy: 1. Late prenatal care 2. H/o MJ use 3. H/o classical c/s 4. H/o preterm delivery  Discharge Diagnoses: Reassuring FHT, reviewed by Dr. Jerene Pitch  Prenatal Procedures: none  Consults: None  Significant Diagnostic Studies:  Results for orders placed or performed during the hospital encounter of 01/20/21 (from the past 168 hour(s))  Fetal fibronectin   Collection Time: 01/20/21  9:11 PM  Result Value Ref Range   Fetal Fibronectin NEGATIVE (A) NEGATIVE    Treatments: analgesia: acetaminophen  Hospital Course:  This is a 20 y.o. G2P0100 with IUP at [redacted]w[redacted]d seen for decreased FM and lower pelvic pain.    FHR baseline: 150 bpm, Variability: moderate, Accelerations: none, Decelerations: none, TOCO: quiet, SVE: deferred   Lower pelvic pain resolved with Tylenol  FFN neg  No leaking of fluid and no bleeding.  She was observed, fetal heart rate monitoring remained reassuring, and she had no signs/symptoms of preterm labor or other maternal-fetal concerns.  She was deemed stable for discharge to home with outpatient follow up.  Discharge Physical Exam:  BP 110/66 (BP Location: Right Arm)   Pulse (!) 109   Temp 98.1 F (36.7 C) (Oral)   Resp 18   Ht 5\' 1"  (1.549 m)   Wt 61.2 kg   LMP 07/30/2020 (Exact Date) Comment: normal  BMI 25.51 kg/m   General: NAD CV: RRR Pulm: CTABL, nl effort ABD: s/nd/nt, gravid DVT Evaluation: LE non-ttp, no evidence of DVT on exam.        Discharge Condition: Stable  Disposition: Discharge disposition: 01-Home or Self Care        Allergies as of 01/20/2021   No Known Allergies     Medication List    TAKE these medications    multivitamin-prenatal 27-0.8 MG Tabs tablet Take 1 tablet by mouth daily at 12 noon.       Follow-up Information    [facility] DEPT. Call in 1 day(s).   Why: to make a follow up appt Contact information: [address] Dawn Bechka [place] [phone]              Signed:  [phone] 01/20/2021 11:16 PM

## 2021-01-20 NOTE — OB Triage Note (Signed)
Pt arrives G2P0 with c/o loss of fetal movement since Friday or Saturday, pt is unsure. Pt also reports lower abdominal pain but no fluid leaking or vaginal bleeding at this time. Pt is ambulatory without assistance with steady gait. Pt denies trauma or injury.

## 2021-01-22 ENCOUNTER — Other Ambulatory Visit: Payer: Self-pay

## 2021-01-22 ENCOUNTER — Other Ambulatory Visit: Payer: Medicaid Other

## 2021-01-22 DIAGNOSIS — Z8751 Personal history of pre-term labor: Secondary | ICD-10-CM

## 2021-01-22 DIAGNOSIS — O09899 Supervision of other high risk pregnancies, unspecified trimester: Secondary | ICD-10-CM

## 2021-01-22 DIAGNOSIS — O09892 Supervision of other high risk pregnancies, second trimester: Secondary | ICD-10-CM

## 2021-01-22 NOTE — Progress Notes (Signed)
In Nurse Clinic for 17 P. Voices no concerns today and denies symptoms preterm labor. 17 P (Makena) administered R arm today, order by K. Alvester Morin, MD dated 12/03/2020. Tolerated well. Has [facility] appt for 01/29/2021, arrive 945 am. Jerel Shepherd, RN

## 2021-01-23 ENCOUNTER — Telehealth: Payer: Self-pay

## 2021-01-23 NOTE — Telephone Encounter (Signed)
[facility] to order next 4 doses of 17-P for next patient appointment on 01/29/2021. Per [facility], next shipment will arrive at [facility] before patient appointment.Burt Knack, RN

## 2021-01-28 ENCOUNTER — Telehealth: Payer: Self-pay

## 2021-01-28 NOTE — Telephone Encounter (Signed)
TC to [facility] to verify that patient's 17-P would arrive by tomorrow as patient has appointment tomorrow morning. Per Victorino Dike that shipment went out today and should arrive by tomorrow. She was unable to to guarantee that shipment would arrive by patient's 10:00 am appointment. Per call to this pharmacy last week on 01/23/2021, they were given patient's next appointment date of 01/29/2021, and they stated they would have the 17-P here by that time. Coming by [employer].Marland KitchenBurt Knack, RN

## 2021-01-28 NOTE — Telephone Encounter (Signed)
Call to client to reschedule 01/29/2021 am [facility] RV appt due to provider availability. Stressed in message left on voicemail that 17P injection could still be given and that provider portion of appt needed to be rescheduled. Number to call provided. Jossie Ng, RN

## 2021-01-29 ENCOUNTER — Ambulatory Visit: Payer: Medicaid Other | Admitting: Family Medicine

## 2021-01-29 ENCOUNTER — Other Ambulatory Visit: Payer: Self-pay

## 2021-01-29 VITALS — BP 101/59 | HR 86 | Temp 97.2°F | Wt 140.4 lb

## 2021-01-29 DIAGNOSIS — Z8751 Personal history of pre-term labor: Secondary | ICD-10-CM

## 2021-01-29 DIAGNOSIS — O0990 Supervision of high risk pregnancy, unspecified, unspecified trimester: Secondary | ICD-10-CM

## 2021-01-29 DIAGNOSIS — F1291 Cannabis use, unspecified, in remission: Secondary | ICD-10-CM

## 2021-01-29 DIAGNOSIS — Z87898 Personal history of other specified conditions: Secondary | ICD-10-CM

## 2021-01-29 DIAGNOSIS — O34219 Maternal care for unspecified type scar from previous cesarean delivery: Secondary | ICD-10-CM

## 2021-01-29 DIAGNOSIS — O09899 Supervision of other high risk pregnancies, unspecified trimester: Secondary | ICD-10-CM

## 2021-01-29 DIAGNOSIS — O0992 Supervision of high risk pregnancy, unspecified, second trimester: Secondary | ICD-10-CM

## 2021-01-29 DIAGNOSIS — O0930 Supervision of pregnancy with insufficient antenatal care, unspecified trimester: Secondary | ICD-10-CM

## 2021-01-29 NOTE — Progress Notes (Signed)
   PRENATAL VISIT NOTE  Subjective:  Dawn Schaefer is a 20 y.o. G2P0100 at [redacted]w[redacted]d being seen today for ongoing prenatal care.  She is currently monitored for the following issues for this high-risk pregnancy and has Supervision of high risk pregnancy, antepartum; History of Classical cesarean delivery, currently pregnant; H/O preterm delivery, currently pregnant; UTI (urinary tract infection) during pregnancy 11/05/20 >100,000 E. 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  Patient reports no complaints. Went to ER for decrease movement and pelvic pain but this has resolved. No sx today.   Contractions: Not present. Vag. Bleeding: None.  Movement: Present. Denies leaking of fluid/ROM.   The following portions of the patient's history were reviewed and updated as appropriate: allergies, current medications, past family history, past medical history, past social history, past surgical history and problem list. Problem list updated.  Objective:   Vitals:   01/29/21 0947  BP: (!) 101/59  Pulse: 86  Temp: (!) 97.2 F (36.2 C)  Weight: 140 lb 6.4 oz (63.7 kg)    Fetal Status: Fetal Heart Rate (bpm): 140 Fundal Height: 26 cm Movement: Present     General:  Alert, oriented and cooperative. Patient is in no acute distress.  Skin: Skin is warm and dry. No rash noted.   Cardiovascular: Normal heart rate noted  Respiratory: Normal respiratory effort, no problems with respiration noted  Abdomen: Soft, gravid, appropriate for gestational age.  Pain/Pressure: Absent     Pelvic: Cervical exam deferred        Extremities: Normal range of motion.  Edema: None  Mental Status: Normal mood and affect. Normal behavior. Normal judgment and thought content.   Assessment and Plan:  Pregnancy: G2P0100 at [redacted]w[redacted]d  1. Supervision of high risk pregnancy, antepartum Up to date Reviewed labs at next  visit and vaccine.   2. History of Classical cesarean delivery, currently pregnant CANNOT labor. Will need repeat CS at 37 weeks  Referral to [facility] at 32 wks  3. H/O preterm delivery, currently pregnant Continue 17 P Received dose today  4. 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 Late prenatal care 14 wks   Preterm labor symptoms and general obstetric precautions including but not limited to vaginal bleeding, contractions, leaking of fluid and fetal movement were reviewed in detail with the patient. Please refer to After Visit Summary for other counseling recommendations.   Return in about 2 weeks (around 02/12/2021) for Routine prenatal care, 28 wk labs, before 10am or before 2pm, 17 P same day.  Future Appointments  Date Time Provider Department Center  02/05/2021 10:00 AM [facility] NURSE [facility] None  02/13/2021 10:20 AM [facility] PROVIDER [facility] None    Federico Flake, MD

## 2021-01-29 NOTE — Progress Notes (Signed)
Here today for 26.1 week MH RV and 17P. Taking PNV QD. Went to [facility] ED 01/20/2021 for pelvic pain and decreased FM. Needs Rx for additional PNV. 17P given and tolerated well. Tawny Hopping, RN

## 2021-02-05 ENCOUNTER — Other Ambulatory Visit: Payer: Medicaid Other

## 2021-02-05 ENCOUNTER — Other Ambulatory Visit: Payer: Self-pay

## 2021-02-05 DIAGNOSIS — Z8751 Personal history of pre-term labor: Secondary | ICD-10-CM | POA: Diagnosis not present

## 2021-02-05 DIAGNOSIS — O09899 Supervision of other high risk pregnancies, unspecified trimester: Secondary | ICD-10-CM

## 2021-02-05 DIAGNOSIS — O0993 Supervision of high risk pregnancy, unspecified, third trimester: Secondary | ICD-10-CM

## 2021-02-05 NOTE — Progress Notes (Signed)
In Nurse Clinic for 17 P. Denies symptoms of preterm labor. 17 P admin today per order K. Alvester Morin, MD dated 12/03/2020. Tolerated well R arm. Next appt [facility] RV 02/13/2021, arrive 10 am, pt aware, has reminder. Jerel Shepherd, RN

## 2021-02-13 ENCOUNTER — Other Ambulatory Visit: Payer: Self-pay

## 2021-02-13 ENCOUNTER — Other Ambulatory Visit: Payer: Self-pay | Admitting: Advanced Practice Midwife

## 2021-02-13 ENCOUNTER — Ambulatory Visit: Payer: Medicaid Other | Admitting: Advanced Practice Midwife

## 2021-02-13 VITALS — BP 103/68 | HR 96 | Temp 97.7°F | Wt 143.6 lb

## 2021-02-13 DIAGNOSIS — Z8751 Personal history of pre-term labor: Secondary | ICD-10-CM | POA: Diagnosis not present

## 2021-02-13 DIAGNOSIS — O09899 Supervision of other high risk pregnancies, unspecified trimester: Secondary | ICD-10-CM

## 2021-02-13 DIAGNOSIS — O34219 Maternal care for unspecified type scar from previous cesarean delivery: Secondary | ICD-10-CM

## 2021-02-13 DIAGNOSIS — Z23 Encounter for immunization: Secondary | ICD-10-CM | POA: Diagnosis not present

## 2021-02-13 DIAGNOSIS — O0993 Supervision of high risk pregnancy, unspecified, third trimester: Secondary | ICD-10-CM | POA: Diagnosis not present

## 2021-02-13 DIAGNOSIS — O0990 Supervision of high risk pregnancy, unspecified, unspecified trimester: Secondary | ICD-10-CM

## 2021-02-13 DIAGNOSIS — O0930 Supervision of pregnancy with insufficient antenatal care, unspecified trimester: Secondary | ICD-10-CM

## 2021-02-13 DIAGNOSIS — O0933 Supervision of pregnancy with insufficient antenatal care, third trimester: Secondary | ICD-10-CM

## 2021-02-13 DIAGNOSIS — F1291 Cannabis use, unspecified, in remission: Secondary | ICD-10-CM

## 2021-02-13 LAB — URINALYSIS
Bilirubin, UA: NEGATIVE
Glucose, UA: NEGATIVE
Ketones, UA: NEGATIVE
Nitrite, UA: NEGATIVE
RBC, UA: NEGATIVE
Specific Gravity, UA: 1.025 (ref 1.005–1.030)
Urobilinogen, Ur: 0.2 mg/dL (ref 0.2–1.0)
pH, UA: 7 (ref 5.0–7.5)

## 2021-02-13 LAB — HEMOGLOBIN, FINGERSTICK: Hemoglobin: 11.8 g/dL (ref 11.1–15.9)

## 2021-02-13 NOTE — Progress Notes (Signed)
   PRENATAL VISIT NOTE  Subjective:  Dawn Schaefer is a 20 y.o. G2P0100 at [redacted]w[redacted]d being seen today for ongoing prenatal care.  She is currently monitored for the following issues for this high-risk pregnancy and has Supervision of high risk pregnancy, antepartum; History of Classical cesarean delivery, currently pregnant; H/O preterm delivery, currently pregnant; UTI (urinary tract infection) during pregnancy 11/05/20 >100,000 E. 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  Patient reports no complaints.  Contractions: Not present. Vag. Bleeding: None.  Movement: Present. Denies leaking of fluid/ROM.   The following portions of the patient's history were reviewed and updated as appropriate: allergies, current medications, past family history, past medical history, past social history, past surgical history and problem list. Problem list updated.  Objective:   Vitals:   02/13/21 1018  BP: 103/68  Pulse: 96  Temp: 97.7 F (36.5 C)  Weight: 143 lb 9.6 oz (65.1 kg)    Fetal Status: Fetal Heart Rate (bpm): 120 Fundal Height: 30 cm Movement: Present     General:  Alert, oriented and cooperative. Patient is in no acute distress.  Skin: Skin is warm and dry. No rash noted.   Cardiovascular: Normal heart rate noted  Respiratory: Normal respiratory effort, no problems with respiration noted  Abdomen: Soft, gravid, appropriate for gestational age.  Pain/Pressure: Absent     Pelvic: Cervical exam deferred        Extremities: Normal range of motion.  Edema: None  Mental Status: Normal mood and affect. Normal behavior. Normal judgment and thought content.   Assessment and Plan:  Pregnancy: G2P0100 at [redacted]w[redacted]d  1. Supervision of high risk pregnancy, antepartum Feels well. Living alone. Not working, not in school.  Female cousin pays for her housing and food. Has enough food.  FOB left her  after discovering pregnancy. 1 hour glucola today 22 lb 9.6 oz (10.3 kg)  - Glucose, 1 hour gestational - HIV Antibody (routine testing w rflx) - RPR - Hemoglobin, venipuncture - Urinalysis (Urine Dip) - [id number] Drug Screen  2. History of Classical cesarean delivery, currently pregnant Delivery plans apt referral written for [facility] c/s at 36-37 wks  3. H/O preterm delivery, currently pregnant Received 17-P today and weekly since 18 wks  4. Late prenatal care 14 wks   5. 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 Please refer to After Visit Summary for other counseling recommendations.  Return in about 2 weeks (around 02/27/2021) for routine PNC.  No future appointments.  Alberteen Spindle, CNM

## 2021-02-13 NOTE — Progress Notes (Addendum)
Here today for 28.2 week MH RV. Taking PNV QD. Needs PNV refill sent to preferred pharmacy. Denies ED/hospital visits since last RV. 28 week labs, Tdap and 17P given today. Needs delivery plans appt at [facility] ~ 32 weeks. Tawny Hopping, RN

## 2021-02-14 LAB — 789231 7+OXYCODONE-BUND
Amphetamines, Urine: NEGATIVE ng/mL
BENZODIAZ UR QL: NEGATIVE ng/mL
Barbiturate screen, urine: NEGATIVE ng/mL
Cannabinoid Quant, Ur: NEGATIVE ng/mL
Cocaine (Metab.): NEGATIVE ng/mL
OPIATE SCREEN URINE: NEGATIVE ng/mL
Oxycodone/Oxymorphone, Urine: NEGATIVE ng/mL
PCP Quant, Ur: NEGATIVE ng/mL

## 2021-02-15 LAB — GLUCOSE, 1 HOUR GESTATIONAL: Gestational Diabetes Screen: 110 mg/dL (ref 65–139)

## 2021-02-15 LAB — HIV-1/HIV-2 QUALITATIVE RNA
HIV-1 RNA, Qualitative: NONREACTIVE
HIV-2 RNA, Qualitative: NONREACTIVE

## 2021-02-15 LAB — RPR: RPR Ser Ql: NONREACTIVE

## 2021-02-19 ENCOUNTER — Telehealth: Payer: Self-pay

## 2021-02-19 NOTE — Telephone Encounter (Signed)
Call to [facility] to ascertain if delivery plans scheduled as referral / records faxed 02/13/2021. Appt scheduled for 03/22/21 at 3:45 pm. Call to client to verify aware of appt and per recorded message, voicemail box is full. Call to emergency contact and per recorded message, voicemail has not been set up. Jossie Ng, RN

## 2021-02-19 NOTE — Telephone Encounter (Signed)
Call to [facility] to ascertain if delivery plans appt scheduled as referral / records faxed 02/13/2021. Appt scheduled for 03/22/2021 at 3:45 pm (arrive 3:30 pm). Call to client to verify aware of appt and per recorded message, voicemail box is full. Call to emergency contact and per recorded message, voicemail has not been set up. Jossie Ng, RN

## 2021-02-20 ENCOUNTER — Other Ambulatory Visit: Payer: Self-pay

## 2021-02-20 ENCOUNTER — Other Ambulatory Visit: Payer: Medicaid Other

## 2021-02-20 DIAGNOSIS — O0993 Supervision of high risk pregnancy, unspecified, third trimester: Secondary | ICD-10-CM

## 2021-02-20 DIAGNOSIS — Z8751 Personal history of pre-term labor: Secondary | ICD-10-CM | POA: Diagnosis not present

## 2021-02-20 DIAGNOSIS — O09899 Supervision of other high risk pregnancies, unspecified trimester: Secondary | ICD-10-CM

## 2021-02-20 NOTE — Telephone Encounter (Signed)
Patient has next scheduled appt here 02/27/21 for RV and 17P. Maternal staff will inform patient at this appt of scheduled delivery plans appt for 03/22/21. Tawny Hopping, RN

## 2021-02-20 NOTE — Progress Notes (Signed)
In Nurse Clinic for 17 P. Voices no concerns and denies signs of preterm  labor. 17 P (Makena) administered today R arm per order by Ralene Bathe, MD dated 12/03/2020. Tolerated well. Next MHC RV appt with 17 P scheduled 02/27/2021, pt aware. Jerel Shepherd, RN

## 2021-02-25 NOTE — Addendum Note (Signed)
Addended by: Heywood Bene on: 02/25/2021 11:09 AM   Modules accepted: Orders

## 2021-02-27 ENCOUNTER — Ambulatory Visit: Payer: Medicaid Other | Admitting: Advanced Practice Midwife

## 2021-02-27 ENCOUNTER — Other Ambulatory Visit: Payer: Self-pay

## 2021-02-27 VITALS — BP 112/71 | HR 97 | Temp 97.5°F | Wt 147.0 lb

## 2021-02-27 DIAGNOSIS — O0993 Supervision of high risk pregnancy, unspecified, third trimester: Secondary | ICD-10-CM

## 2021-02-27 DIAGNOSIS — Z8751 Personal history of pre-term labor: Secondary | ICD-10-CM | POA: Diagnosis not present

## 2021-02-27 DIAGNOSIS — O09899 Supervision of other high risk pregnancies, unspecified trimester: Secondary | ICD-10-CM

## 2021-02-27 DIAGNOSIS — O34219 Maternal care for unspecified type scar from previous cesarean delivery: Secondary | ICD-10-CM

## 2021-02-27 DIAGNOSIS — F1291 Cannabis use, unspecified, in remission: Secondary | ICD-10-CM

## 2021-02-27 DIAGNOSIS — O0990 Supervision of high risk pregnancy, unspecified, unspecified trimester: Secondary | ICD-10-CM | POA: Diagnosis not present

## 2021-02-27 NOTE — Progress Notes (Signed)
Here today for 30.2 week MH RV and 17P injection. Taking PNV QD. Denies ED/hospital visits since last RV. 17P given and tolerated well. Tawny Hopping, RN

## 2021-02-27 NOTE — Progress Notes (Signed)
   PRENATAL VISIT NOTE  Subjective:  Dawn Schaefer is a 20 y.o. G2P0100 at [redacted]w[redacted]d being seen today for ongoing prenatal care.  She is currently monitored for the following issues for this high-risk pregnancy and has Supervision of high risk pregnancy, antepartum; History of Classical cesarean delivery, currently pregnant; H/O preterm delivery, currently pregnant; UTI (urinary tract infection) during pregnancy 11/05/20 >100,000 E. 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  Patient reports no complaints.  Contractions: Not present. Vag. Bleeding: None.  Movement: Present. Denies leaking of fluid/ROM.   The following portions of the patient's history were reviewed and updated as appropriate: allergies, current medications, past family history, past medical history, past social history, past surgical history and problem list. Problem list updated.  Objective:   Vitals:   02/27/21 1036  BP: 112/71  Pulse: 97  Temp: (!) 97.5 F (36.4 C)  Weight: 147 lb (66.7 kg)    Fetal Status: Fetal Heart Rate (bpm): 120 Fundal Height: 30 cm Movement: Present     General:  Alert, oriented and cooperative. Patient is in no acute distress.  Skin: Skin is warm and dry. No rash noted.   Cardiovascular: Normal heart rate noted  Respiratory: Normal respiratory effort, no problems with respiration noted  Abdomen: Soft, gravid, appropriate for gestational age.  Pain/Pressure: Absent     Pelvic: Cervical exam deferred        Extremities: Normal range of motion.  Edema: None  Mental Status: Normal mood and affect. Normal behavior. Normal judgment and thought content.   Assessment and Plan:  Pregnancy: G2P0100 at [redacted]w[redacted]d  1. Supervision of high risk pregnancy, antepartum Feels well. Getting supplies for baby. Living alone 1 hour glucola=110 on 02/13/21 UDS neg on 02/13/21. Denies MJ use since  07/2020 Initiated 17-P on 12/03/20 (18 wks)--hx PTL  2. History of Classical cesarean delivery, currently pregnant Cannot labor. Has [facility] delivery plans apt 03/22/21 with repeat c/s at 36-37 wks   Preterm labor symptoms and general obstetric precautions including but not limited to vaginal bleeding, contractions, leaking of fluid and fetal movement were reviewed in detail with the patient. Please refer to After Visit Summary for other counseling recommendations.  Return in about 2 weeks (around 03/13/2021) for Also needs 1 week 17P appt.  No future appointments.  Alberteen Spindle, CNM

## 2021-03-01 ENCOUNTER — Telehealth: Payer: Self-pay

## 2021-03-01 NOTE — Telephone Encounter (Signed)
[facility]--error in date of 17-P delivery. Patient next appointment is 03/06/2021, so shipment needs to arrive 03/05/2021. Per [facility], they will ship 17-P to arrive 03/05/2021. Disregard previous phone note.Burt Knack, RN

## 2021-03-01 NOTE — Telephone Encounter (Signed)
[facility] to order next 4 doses 17-P for patient next appointment on 03/13/2021. [facility] told that we need the delivery on 03/12/2021 as patient has appointment on 03/13/2021 am. Address for shipment verified and this RN assured that shipment would be sent to arrive 03/12/2021.Marland KitchenMarland KitchenBurt Knack, RN

## 2021-03-06 ENCOUNTER — Other Ambulatory Visit: Payer: Medicaid Other

## 2021-03-06 ENCOUNTER — Other Ambulatory Visit: Payer: Self-pay

## 2021-03-06 DIAGNOSIS — Z8751 Personal history of pre-term labor: Secondary | ICD-10-CM

## 2021-03-06 DIAGNOSIS — O0993 Supervision of high risk pregnancy, unspecified, third trimester: Secondary | ICD-10-CM

## 2021-03-06 DIAGNOSIS — O09899 Supervision of other high risk pregnancies, unspecified trimester: Secondary | ICD-10-CM

## 2021-03-06 NOTE — Progress Notes (Signed)
Pt denies symptoms of PTL and expresses understanding of steps to take if they occur. 17P administered per Dr. Lyndel Safe order 12/03/20.

## 2021-03-13 ENCOUNTER — Ambulatory Visit: Payer: Medicaid Other | Admitting: Advanced Practice Midwife

## 2021-03-13 ENCOUNTER — Other Ambulatory Visit: Payer: Self-pay

## 2021-03-13 VITALS — BP 110/72 | HR 108 | Temp 97.3°F | Wt 150.6 lb

## 2021-03-13 DIAGNOSIS — O0930 Supervision of pregnancy with insufficient antenatal care, unspecified trimester: Secondary | ICD-10-CM

## 2021-03-13 DIAGNOSIS — F1291 Cannabis use, unspecified, in remission: Secondary | ICD-10-CM

## 2021-03-13 DIAGNOSIS — Z8751 Personal history of pre-term labor: Secondary | ICD-10-CM

## 2021-03-13 DIAGNOSIS — O0993 Supervision of high risk pregnancy, unspecified, third trimester: Secondary | ICD-10-CM

## 2021-03-13 DIAGNOSIS — O34219 Maternal care for unspecified type scar from previous cesarean delivery: Secondary | ICD-10-CM

## 2021-03-13 DIAGNOSIS — O0990 Supervision of high risk pregnancy, unspecified, unspecified trimester: Secondary | ICD-10-CM

## 2021-03-13 NOTE — Progress Notes (Signed)
Patient here for MH RV at 32 2/7. Kick counts reviewed and cards given. Patient still undecided about pediatrician, has resource list. 17-P given today.Burt Knack, RN

## 2021-03-13 NOTE — Progress Notes (Signed)
   PRENATAL VISIT NOTE  Subjective:  Dawn Schaefer is a 20 y.o. G2P0100 at [redacted]w[redacted]d being seen today for ongoing prenatal care.  She is currently monitored for the following issues for this high-risk pregnancy and has Supervision of high risk pregnancy, antepartum; History of Classical cesarean delivery, currently pregnant; H/O preterm delivery, currently pregnant; UTI (urinary tract infection) during pregnancy 11/05/20 >100,000 E. 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  Patient reports no complaints.  Contractions: Not present. Vag. Bleeding: None.  Movement: Present. Denies leaking of fluid/ROM.   The following portions of the patient's history were reviewed and updated as appropriate: allergies, current medications, past family history, past medical history, past social history, past surgical history and problem list. Problem list updated.  Objective:   Vitals:   03/13/21 1115  BP: 110/72  Pulse: (!) 108  Temp: (!) 97.3 F (36.3 C)  Weight: 150 lb 9.6 oz (68.3 kg)    Fetal Status: Fetal Heart Rate (bpm): 140 Fundal Height: 32 cm Movement: Present     General:  Alert, oriented and cooperative. Patient is in no acute distress.  Skin: Skin is warm and dry. No rash noted.   Cardiovascular: Normal heart rate noted  Respiratory: Normal respiratory effort, no problems with respiration noted  Abdomen: Soft, gravid, appropriate for gestational age.  Pain/Pressure: Absent     Pelvic: Cervical exam deferred        Extremities: Normal range of motion.  Edema: None  Mental Status: Normal mood and affect. Normal behavior. Normal judgment and thought content.   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 Pt afraid because FOB smoked meth but she is no longer with him and her last MJ use was 2021. Pt asking for another u/s. Reviewed last u/s 01/03/21 at 22 5/7 with good growth, AFI wnl, normal anatomy. Pt to mention her concerns at delivery plans apt to MD  Preterm labor symptoms and general obstetric precautions including but not limited to vaginal bleeding, contractions, leaking of fluid and fetal movement were reviewed in detail with the patient. Please refer to After Visit Summary for other counseling recommendations.  No follow-ups on file.  Future Appointments  Date Time Provider Department Center  03/20/2021 10:10 AM [facility] NURSE [facility] None  03/27/2021 10:20 AM [facility] PROVIDER AC-MAT None    Alberteen Spindle, CNM

## 2021-03-20 ENCOUNTER — Telehealth: Payer: Self-pay

## 2021-03-20 ENCOUNTER — Other Ambulatory Visit: Payer: Self-pay

## 2021-03-20 NOTE — Telephone Encounter (Signed)
[facility] to order next doses of 17-P. Patient currently has 2 doses at [facility]. Appointments 03/21/2021 and 03/27/2021. Pharmacy was asked to send next shipment out on 03/28/2021, and they agreed to send on that date. Burt Knack, RN

## 2021-03-21 ENCOUNTER — Other Ambulatory Visit: Payer: Self-pay

## 2021-03-21 ENCOUNTER — Other Ambulatory Visit: Payer: Medicaid Other

## 2021-03-21 DIAGNOSIS — O09899 Supervision of other high risk pregnancies, unspecified trimester: Secondary | ICD-10-CM

## 2021-03-21 DIAGNOSIS — O0993 Supervision of high risk pregnancy, unspecified, third trimester: Secondary | ICD-10-CM

## 2021-03-21 DIAGNOSIS — Z8751 Personal history of pre-term labor: Secondary | ICD-10-CM

## 2021-03-21 NOTE — Progress Notes (Signed)
In Nurse Clinic for 17 P. Denies signs of preterm labor. 17 P (Makena autoinjector) given Left arm without prob. Order by K. Alvester Morin, MD dated 12/03/2020. Has appt 03/27/2021 for MHC RV and 17 P,  pt aware. Jerel Shepherd, RN

## 2021-03-26 NOTE — H&P (Signed)
Dawn Schaefer is a 20 y.o. female presenting for elective repeat cesarean section . Prior classical c/s at 24 weeks for PPROM .  EDC=05/06/21  [facility] pt. Scheduled repeat on 04/11/21 = 36+3 weeks  OB History     Gravida  2   Para  1   Term  0   Preterm  1   AB  0   Living  0      SAB  0   IAB  0   Ectopic  0   Multiple  0   Live Births  1          Past Medical History:  Diagnosis Date   History of threatened premature labor    Nausea and vomiting    Patient denies medical problems    Preterm labor    Past Surgical History:  Procedure Laterality Date   CESAREAN SECTION  06/05/2019   history of C-section     2020   WISDOM TOOTH EXTRACTION     s4   Family History: family history includes Hypertension in her maternal grandmother. Social History:  reports that she quit smoking about 7 months ago. Her smoking use included cigarettes. She has never used smokeless tobacco. She reports that she does not drink alcohol and does not use drugs.     Maternal Diabetes: No Genetic Screening: Declined Maternal Ultrasounds/Referrals: Normal Fetal Ultrasounds or other Referrals:  None Maternal Substance Abuse:  No Significant Maternal Medications:  None Significant Maternal Lab Results:  None Other Comments:  None  Review of Systems Review of Systems: A full review of systems was performed and negative except as noted in the HPI.   Eyes: no vision change  Ears: left ear pain  Oropharynx: no sore throat  Pulmonary . No shortness of breath , no hemoptysis Cardiovascular: no chest pain , no irregular heart beat  Gastrointestinal:no blood in stool . No diarrhea, no constipation Uro gynecologic: no dysuria , no pelvic pain Neurologic : no seizure , no migraines    Musculoskeletal: no muscular weakness    Last menstrual period 07/30/2020. Exam Physical Exam  Lungs CTA   Cv RRR  Abd: gravid  Incision well healed  Prenatal labs: ABO, Rh: O/Positive/--  (02/28 1515) Antibody: Negative (02/28 1515) Rubella:  MMRx2, Varicella : varivax x 2 RPR: Non Reactive (06/08 1130)  HBsAg: Negative (02/28 1515)  HIV:   neg GBS:   not done   Assessment/Plan: Prior classical cesarean section . Need for early delivery 36-37 weeks to avoid uterine rupture . Scheduled at 36+3 weeks  Risks discussed with patient    Suzy Bouchard 03/26/2021, 1:13 PM

## 2021-03-27 ENCOUNTER — Other Ambulatory Visit: Payer: Self-pay

## 2021-03-27 ENCOUNTER — Ambulatory Visit: Payer: Medicaid Other | Admitting: Family Medicine

## 2021-03-27 DIAGNOSIS — O0993 Supervision of high risk pregnancy, unspecified, third trimester: Secondary | ICD-10-CM

## 2021-03-27 DIAGNOSIS — O34219 Maternal care for unspecified type scar from previous cesarean delivery: Secondary | ICD-10-CM

## 2021-03-27 DIAGNOSIS — Z8751 Personal history of pre-term labor: Secondary | ICD-10-CM

## 2021-03-27 DIAGNOSIS — O0990 Supervision of high risk pregnancy, unspecified, unspecified trimester: Secondary | ICD-10-CM

## 2021-03-27 NOTE — Progress Notes (Signed)
   PRENATAL VISIT NOTE  Subjective:  Dawn Schaefer is a 20 y.o. G2P0100 at [redacted]w[redacted]d being seen today for ongoing prenatal care.  She is currently monitored for the following issues for this high-risk pregnancy and has Supervision of high risk pregnancy, antepartum; History of Classical cesarean delivery, currently pregnant; H/O preterm delivery, currently pregnant; UTI (urinary tract infection) during pregnancy 11/05/20 >100,000 E. 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  Patient reports no complaints.  Contractions: Irritability. Vag. Bleeding: None.  Movement: Present. Denies leaking of fluid/ROM.   The following portions of the patient's history were reviewed and updated as appropriate: allergies, current medications, past family history, past medical history, past social history, past surgical history and problem list. Problem list updated.  Objective:   Vitals:   03/27/21 1023  BP: 106/69  Pulse: (!) 106  Temp: (!) 97.5 F (36.4 C)  Weight: 151 lb 9.6 oz (68.8 kg)    Fetal Status: Fetal Heart Rate (bpm): 129 Fundal Height: 34 cm Movement: Present  Presentation: Vertex  General:  Alert, oriented and cooperative. Patient is in no acute distress.  Skin: Skin is warm and dry. No rash noted.   Cardiovascular: Normal heart rate noted  Respiratory: Normal respiratory effort, no problems with respiration noted  Abdomen: Soft, gravid, appropriate for gestational age.  Pain/Pressure: Absent     Pelvic: Cervical exam deferred        Extremities: Normal range of motion.  Edema: Trace  Mental Status: Normal mood and affect. Normal behavior. Normal judgment and thought content.   Assessment and Plan:  Pregnancy: G2P0100 at [redacted]w[redacted]d  1. Supervision of high risk pregnancy, antepartum -Reviewed labs from 28 wk visit -As part of routine education we reviewed s/sx of pre-eclampsia  and to seek medical care ASAP if present.  -PP contraception plan is OCP  -Feeding plan is breast and  -Reviewed kick counts.   2. History of Classical cesarean delivery, currently pregnant  Repeat c/s delivery plans made.  Appointment is 04/11/21, with preop appointment on 04/09/21.     Preterm labor symptoms and general obstetric precautions including but not limited to vaginal bleeding, contractions, leaking of fluid and fetal movement were reviewed in detail with the patient. Please refer to After Visit Summary for other counseling recommendations.  Return in about 12 days (around 04/08/2021) for routine prenatal care, 36 week labs.  Future Appointments  Date Time Provider Department Center  04/03/2021  9:10 AM [facility] NURSE [facility] None  04/04/2021 10:45 AM [facility] PAT3 [facility] None  04/08/2021  1:20 PM [facility] PROVIDER AC-[facility] None  04/09/2021  8:10 AM [facility] None    Wendi Snipes, FNP

## 2021-03-27 NOTE — Progress Notes (Signed)
Here today for 34.2 week MH RV and 17P injection. Taking PNV QD. Kept 03/22/2021 delivery plans appt with [facility]. Per patient C-section scheduled for 04/11/2021 with pre admission Covid testing on 04/09/2021. Per patient she is to be informed at 04/09/2021 appt what time to present to hospital on 04/11/2021 for C-section. 17P given and tolerated well. Tawny Hopping, RN

## 2021-04-03 ENCOUNTER — Other Ambulatory Visit: Payer: Medicaid Other

## 2021-04-03 ENCOUNTER — Other Ambulatory Visit: Payer: Self-pay

## 2021-04-03 DIAGNOSIS — O0993 Supervision of high risk pregnancy, unspecified, third trimester: Secondary | ICD-10-CM | POA: Diagnosis not present

## 2021-04-03 DIAGNOSIS — O09899 Supervision of other high risk pregnancies, unspecified trimester: Secondary | ICD-10-CM

## 2021-04-03 DIAGNOSIS — Z8751 Personal history of pre-term labor: Secondary | ICD-10-CM | POA: Diagnosis not present

## 2021-04-03 NOTE — Progress Notes (Signed)
In Nurse Clinic for 17 P. Denies signs of preterm labor. 17 P (Makena autoinjector) administered today L arm without problem.17 P order by Dr. Ralene Bathe dated 12/03/2020. Next [facility] visit scheduled 04/08/2021, pt aware. Jerel Shepherd, RN

## 2021-04-04 ENCOUNTER — Other Ambulatory Visit
Admission: RE | Admit: 2021-04-04 | Discharge: 2021-04-04 | Disposition: A | Discharge status: Home / Self Care | Payer: Medicaid Other | Source: Ambulatory Visit | Attending: Obstetrics and Gynecology | Admitting: Obstetrics and Gynecology

## 2021-04-04 ENCOUNTER — Other Ambulatory Visit: Payer: Self-pay

## 2021-04-04 NOTE — Patient Instructions (Addendum)
Your procedure is scheduled on: 04/11/21 - Thursday Report to the Registration Desk on the 1st floor of the [facility] at 7:40 am. Report to [facility] on 04/09/21 at 9:00 am for Labs and Covid Test.  REMEMBER: Instructions that are not followed completely may result in serious medical risk, up to and including death; or upon the discretion of your surgeon and anesthesiologist your surgery may need to be rescheduled.  Do not eat food after midnight the night before surgery.  No gum chewing, lozengers or hard candies.  You may however, drink CLEAR liquids up to 2 hours before you are scheduled to arrive for your surgery. Do not drink anything within 2 hours of your scheduled arrival time.  Clear liquids include: - water  - apple juice without pulp - gatorade (not RED, PURPLE, OR BLUE) - black coffee or tea (Do NOT add milk or creamers to the coffee or tea) Do NOT drink anything that is not on this list.   TAKE THESE MEDICATIONS THE MORNING OF SURGERY WITH A SIP OF WATER: None   One week prior to surgery: Stop Anti-inflammatories (NSAIDS) such as Advil, Aleve, Ibuprofen, Motrin, Naproxen, Naprosyn and Aspirin based products such as Excedrin, Goodys Powder, BC Powder.  Stop ANY OVER THE COUNTER supplements until after surgery.  You may take Tylenol as directed if needed for pain up until the day of surgery.  No Alcohol for 24 hours before or after surgery.  No Smoking including e-cigarettes for 24 hours prior to surgery.  No chewable tobacco products for at least 6 hours prior to surgery.  No nicotine patches on the day of surgery.  Do not use any "recreational" drugs for at least a week prior to your surgery.  Please be advised that the combination of cocaine and anesthesia may have negative outcomes, up to and including death. If you test positive for cocaine, your surgery will be cancelled.  On the morning of surgery brush your teeth with toothpaste and water, you may  rinse your mouth with mouthwash if you wish. Do not swallow any toothpaste or mouthwash.  Do not wear jewelry, make-up, hairpins, clips or nail polish.  Do not wear lotions, powders, or perfumes.   Do not shave body from the neck down 48 hours prior to surgery just in case you cut yourself which could leave a site for infection.  Also, freshly shaved skin may become irritated if using the CHG soap.  Contact lenses, hearing aids and dentures may not be worn into surgery.  Do not bring valuables to the hospital. [facility] is not responsible for any missing/lost belongings or valuables.   Use CHG Soap or wipes as directed on instruction sheet.  Notify your doctor if there is any change in your medical condition (cold, fever, infection).  Wear comfortable clothing (specific to your surgery type) to the hospital.  After surgery, you can help prevent lung complications by doing breathing exercises.  Take deep breaths and cough every 1-2 hours. Your doctor may order a device called an Incentive Spirometer to help you take deep breaths. When coughing or sneezing, hold a pillow firmly against your incision with both hands. This is called "splinting." Doing this helps protect your incision. It also decreases belly discomfort.  If you are being admitted to the hospital overnight, leave your suitcase in the car. After surgery it may be brought to your room.  If you are being discharged the day of surgery, you will not be allowed  to drive home. You will need a responsible adult (18 years or older) to drive you home and stay with you that night.   If you are taking public transportation, you will need to have a responsible adult (18 years or older) with you. Please confirm with your physician that it is acceptable to use public transportation.   Please call the Pre-admissions Testing Dept. at [phone] if you have any questions about these instructions.  Surgery Visitation  Policy:  Patients undergoing a surgery or procedure may have one family member or support person with them as long as that person is not COVID-19 positive or experiencing its symptoms.  That person may remain in the waiting area during the procedure.  Inpatient Visitation:    Visiting hours are 7 a.m. to 8 p.m. Inpatients will be allowed two visitors daily. The visitors may change each day during the patient's stay. No visitors under the age of 16. Any visitor under the age of 54 must be accompanied by an adult. The visitor must pass COVID-19 screenings, use hand sanitizer when entering and exiting the patient's room and wear a mask at all times, including in the patient's room. Patients must also wear a mask when staff or their visitor are in the room. Masking is required regardless of vaccination status.

## 2021-04-08 ENCOUNTER — Other Ambulatory Visit: Payer: Self-pay

## 2021-04-08 ENCOUNTER — Ambulatory Visit: Payer: Medicaid Other | Admitting: Advanced Practice Midwife

## 2021-04-08 VITALS — BP 110/67 | HR 96 | Temp 97.8°F | Wt 156.6 lb

## 2021-04-08 DIAGNOSIS — O0990 Supervision of high risk pregnancy, unspecified, unspecified trimester: Secondary | ICD-10-CM

## 2021-04-08 DIAGNOSIS — Z8751 Personal history of pre-term labor: Secondary | ICD-10-CM

## 2021-04-08 DIAGNOSIS — F1291 Cannabis use, unspecified, in remission: Secondary | ICD-10-CM

## 2021-04-08 DIAGNOSIS — O09899 Supervision of other high risk pregnancies, unspecified trimester: Secondary | ICD-10-CM

## 2021-04-08 DIAGNOSIS — O34219 Maternal care for unspecified type scar from previous cesarean delivery: Secondary | ICD-10-CM

## 2021-04-08 DIAGNOSIS — O0993 Supervision of high risk pregnancy, unspecified, third trimester: Secondary | ICD-10-CM

## 2021-04-08 DIAGNOSIS — Z87898 Personal history of other specified conditions: Secondary | ICD-10-CM

## 2021-04-08 NOTE — Progress Notes (Signed)
Patient here for MH RV at 36 weeks. 36 week labs and packet today. Last 17-P today. Has preadmission testing tomorrow and scheduled C-section on 04/11/2021. Patient is self-collecting her labs.Burt Knack, RN

## 2021-04-08 NOTE — Progress Notes (Signed)
   PRENATAL VISIT NOTE  Subjective:  Dawn Schaefer is a 20 y.o. G2P0100 at [redacted]w[redacted]d being seen today for ongoing prenatal care.  She is currently monitored for the following issues for this high-risk pregnancy and has Supervision of high risk pregnancy, antepartum; History of Classical cesarean delivery, currently pregnant; H/O preterm delivery, currently pregnant; UTI (urinary tract infection) during pregnancy 11/05/20 >100,000 E. 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  Patient reports no complaints.  Contractions: Not present. Vag. Bleeding: None.  Movement: Present. Denies leaking of fluid/ROM.   The following portions of the patient's history were reviewed and updated as appropriate: allergies, current medications, past family history, past medical history, past social history, past surgical history and problem list. Problem list updated.  Objective:   Vitals:   04/08/21 1329  BP: 110/67  Pulse: 96  Temp: 97.8 F (36.6 C)  Weight: 156 lb 9.6 oz (71 kg)    Fetal Status: Fetal Heart Rate (bpm): 140 Fundal Height: 35 cm Movement: Present  Presentation: Vertex  General:  Alert, oriented and cooperative. Patient is in no acute distress.  Skin: Skin is warm and dry. No rash noted.   Cardiovascular: Normal heart rate noted  Respiratory: Normal respiratory effort, no problems with respiration noted  Abdomen: Soft, gravid, appropriate for gestational age.  Pain/Pressure: Absent     Pelvic: Cervical exam deferred        Extremities: Normal range of motion.  Edema: None  Mental Status: Normal mood and affect. Normal behavior. Normal judgment and thought content.   Assessment and Plan:  Pregnancy: G2P0100 at [redacted]w[redacted]d  1. Supervision of high risk pregnancy in third trimester 35 lb 9.6 oz (16.1 kg) Self collected GBS/GC/Chlamydia cultures Knows when to go to L&D. Has car seat  and ready for baby at home - Chlamydia/GC NAA, Confirmation - Culture, beta strep (group b only)  2. History of Classical cesarean delivery, currently pregnant Repeat c/s 04/11/21  3. History of marijuana use; on probation with court date 12/27/20   4. H/O preterm delivery, currently pregnant Last 17-P today     Preterm labor symptoms and general obstetric precautions including but not limited to vaginal bleeding, contractions, leaking of fluid and fetal movement were reviewed in detail with the patient. Please refer to After Visit Summary for other counseling recommendations.  No follow-ups on file.  Future Appointments  Date Time Provider Department Center  04/09/2021  9:00 AM [facility] None    Alberteen Spindle, CNM

## 2021-04-08 NOTE — Progress Notes (Signed)
17-P given today, right arm, tolerated well. Patient aware this is last 17-P and states she will be told what time to arrive for her C-section at her pre-admit appointment tomorrow. Patient counseled to call for [facility] appointment after birth of her baby. Patient states understanding.Burt Knack, RN

## 2021-04-09 ENCOUNTER — Other Ambulatory Visit
Admission: RE | Admit: 2021-04-09 | Discharge: 2021-04-09 | Disposition: A | Discharge status: Home / Self Care | Payer: Medicaid Other | Source: Ambulatory Visit | Attending: Obstetrics and Gynecology | Admitting: Obstetrics and Gynecology

## 2021-04-09 ENCOUNTER — Encounter: Payer: Self-pay | Admitting: Urgent Care

## 2021-04-09 DIAGNOSIS — Z20822 Contact with and (suspected) exposure to covid-19: Secondary | ICD-10-CM | POA: Insufficient documentation

## 2021-04-09 DIAGNOSIS — Z01812 Encounter for preprocedural laboratory examination: Secondary | ICD-10-CM | POA: Insufficient documentation

## 2021-04-09 DIAGNOSIS — Z98891 History of uterine scar from previous surgery: Secondary | ICD-10-CM | POA: Insufficient documentation

## 2021-04-09 LAB — CBC
HCT: 34.3 % — ABNORMAL LOW (ref 36.0–46.0)
Hemoglobin: 12.1 g/dL (ref 12.0–15.0)
MCH: 32.7 pg (ref 26.0–34.0)
MCHC: 35.3 g/dL (ref 30.0–36.0)
MCV: 92.7 fL (ref 80.0–100.0)
Platelets: 200 10*3/uL (ref 150–400)
RBC: 3.7 MIL/uL — ABNORMAL LOW (ref 3.87–5.11)
RDW: 13.4 % (ref 11.5–15.5)
WBC: 9.4 10*3/uL (ref 4.0–10.5)
nRBC: 0 % (ref 0.0–0.2)

## 2021-04-09 LAB — BASIC METABOLIC PANEL
Anion gap: 3 — ABNORMAL LOW (ref 5–15)
BUN: 8 mg/dL (ref 6–20)
CO2: 24 mmol/L (ref 22–32)
Calcium: 8.7 mg/dL — ABNORMAL LOW (ref 8.9–10.3)
Chloride: 108 mmol/L (ref 98–111)
Creatinine, Ser: 0.59 mg/dL (ref 0.44–1.00)
GFR, Estimated: >60 mL/min (ref >60)
Glucose, Bld: 78 mg/dL (ref 70–99)
Potassium: 3.8 mmol/L (ref 3.5–5.1)
Sodium: 135 mmol/L (ref 135–145)

## 2021-04-09 LAB — SARS CORONAVIRUS 2 (TAT 6-24 HRS): SARS Coronavirus 2: NEGATIVE

## 2021-04-10 ENCOUNTER — Ambulatory Visit: Payer: Self-pay

## 2021-04-11 ENCOUNTER — Inpatient Hospital Stay
Admission: RE | Admit: 2021-04-11 | Discharge: 2021-04-13 | DRG: 787 | Disposition: A | Discharge status: Home / Self Care | Payer: Medicaid Other | Attending: Obstetrics and Gynecology | Admitting: Obstetrics and Gynecology

## 2021-04-11 ENCOUNTER — Inpatient Hospital Stay: Payer: Medicaid Other

## 2021-04-11 ENCOUNTER — Encounter: Payer: Self-pay | Admitting: Obstetrics and Gynecology

## 2021-04-11 ENCOUNTER — Other Ambulatory Visit: Payer: Self-pay

## 2021-04-11 ENCOUNTER — Encounter
Admission: RE | Disposition: A | Discharge status: Home / Self Care | Payer: Self-pay | Source: Home / Self Care | Attending: Obstetrics and Gynecology

## 2021-04-11 DIAGNOSIS — O34219 Maternal care for unspecified type scar from previous cesarean delivery: Secondary | ICD-10-CM | POA: Diagnosis present

## 2021-04-11 DIAGNOSIS — O34212 Maternal care for vertical scar from previous cesarean delivery: Secondary | ICD-10-CM | POA: Diagnosis present

## 2021-04-11 DIAGNOSIS — Z3A36 36 weeks gestation of pregnancy: Secondary | ICD-10-CM | POA: Diagnosis not present

## 2021-04-11 DIAGNOSIS — D62 Acute posthemorrhagic anemia: Secondary | ICD-10-CM | POA: Diagnosis not present

## 2021-04-11 DIAGNOSIS — Z87891 Personal history of nicotine dependence: Secondary | ICD-10-CM

## 2021-04-11 DIAGNOSIS — O9081 Anemia of the puerperium: Secondary | ICD-10-CM | POA: Diagnosis not present

## 2021-04-11 DIAGNOSIS — O0993 Supervision of high risk pregnancy, unspecified, third trimester: Secondary | ICD-10-CM | POA: Diagnosis not present

## 2021-04-11 DIAGNOSIS — Z9889 Other specified postprocedural states: Secondary | ICD-10-CM

## 2021-04-11 LAB — ABO/RH: ABO/RH(D): O POS

## 2021-04-11 SURGERY — Surgical Case
Anesthesia: Spinal

## 2021-04-11 MED ORDER — BUPIVACAINE IN DEXTROSE 0.75-8.25 % IT SOLN
INTRATHECAL | Status: DC | PRN
  Administered 2021-04-11: 1.5 mL via INTRATHECAL

## 2021-04-11 MED ORDER — ENOXAPARIN SODIUM 40 MG/0.4ML IJ SOSY
40.0000 mg | PREFILLED_SYRINGE | INTRAMUSCULAR | Status: DC
  Filled 2021-04-11 (×2): qty 0.4

## 2021-04-11 MED ORDER — ONDANSETRON HCL 4 MG/2ML IJ SOLN: 4.0000 mg | Freq: Three times a day (TID) | INTRAMUSCULAR | Status: DC | PRN

## 2021-04-11 MED ORDER — LACTATED RINGERS IV SOLN
Freq: Once | INTRAVENOUS | Status: AC
  Administered 2021-04-11: 1000 mL via INTRAVENOUS

## 2021-04-11 MED ORDER — ONDANSETRON HCL 4 MG/2ML IJ SOLN
INTRAMUSCULAR | Status: DC | PRN
Start: 2021-04-11 — End: 2021-04-11
  Administered 2021-04-11: 4 mg via INTRAVENOUS

## 2021-04-11 MED ORDER — SOD CITRATE-CITRIC ACID 500-334 MG/5ML PO SOLN
30.0000 mL | ORAL | Status: AC
  Administered 2021-04-11: 30 mL via ORAL

## 2021-04-11 MED ORDER — MORPHINE SULFATE (PF) 2 MG/ML IV SOLN: 1.0000 mg | INTRAVENOUS | Status: DC | PRN

## 2021-04-11 MED ORDER — NALOXONE HCL 4 MG/10ML IJ SOLN
1.0000 ug/kg/h | INTRAVENOUS | Status: DC | PRN
  Filled 2021-04-11: qty 5

## 2021-04-11 MED ORDER — KETOROLAC TROMETHAMINE 30 MG/ML IJ SOLN
30.0000 mg | Freq: Four times a day (QID) | INTRAMUSCULAR | Status: DC | PRN
Start: 2021-04-11 — End: 2021-04-11

## 2021-04-11 MED ORDER — NALBUPHINE HCL 10 MG/ML IJ SOLN: 5.0000 mg | Freq: Once | INTRAMUSCULAR | Status: AC | PRN

## 2021-04-11 MED ORDER — NALBUPHINE HCL 10 MG/ML IJ SOLN
INTRAMUSCULAR | Status: AC
  Filled 2021-04-11: qty 1

## 2021-04-11 MED ORDER — OXYTOCIN-SODIUM CHLORIDE 30-0.9 UT/500ML-% IV SOLN
INTRAVENOUS | Status: AC
  Filled 2021-04-11: qty 1000

## 2021-04-11 MED ORDER — CEFAZOLIN SODIUM-DEXTROSE 2-4 GM/100ML-% IV SOLN
2.0000 g | INTRAVENOUS | Status: AC
  Administered 2021-04-11: 2 g via INTRAVENOUS
  Filled 2021-04-11: qty 100

## 2021-04-11 MED ORDER — CHLORHEXIDINE GLUCONATE 0.12 % MT SOLN
15.0000 mL | Freq: Once | OROMUCOSAL | Status: AC
  Administered 2021-04-11: 15 mL via OROMUCOSAL
  Filled 2021-04-11: qty 15

## 2021-04-11 MED ORDER — LACTATED RINGERS IV SOLN
INTRAVENOUS | Status: DC
  Administered 2021-04-11: 1000 mL via INTRAVENOUS

## 2021-04-11 MED ORDER — BUPIVACAINE HCL (PF) 0.5 % IJ SOLN
INTRAMUSCULAR | Status: AC
  Filled 2021-04-11: qty 30

## 2021-04-11 MED ORDER — SIMETHICONE 80 MG PO CHEW
80.0000 mg | CHEWABLE_TABLET | ORAL | Status: DC | PRN
  Administered 2021-04-11: 80 mg via ORAL

## 2021-04-11 MED ORDER — OXYTOCIN-SODIUM CHLORIDE 30-0.9 UT/500ML-% IV SOLN
INTRAVENOUS | Status: DC | PRN
  Administered 2021-04-11: 500 mL via INTRAVENOUS

## 2021-04-11 MED ORDER — NALBUPHINE HCL 10 MG/ML IJ SOLN
5.0000 mg | Freq: Once | INTRAMUSCULAR | Status: AC | PRN
  Administered 2021-04-11: 5 mg via INTRAVENOUS

## 2021-04-11 MED ORDER — DEXAMETHASONE SODIUM PHOSPHATE 10 MG/ML IJ SOLN
INTRAMUSCULAR | Status: DC | PRN
  Administered 2021-04-11: 5 mg via INTRAVENOUS

## 2021-04-11 MED ORDER — TETANUS-DIPHTH-ACELL PERTUSSIS 5-2.5-18.5 LF-MCG/0.5 IM SUSY: 0.5000 mL | PREFILLED_SYRINGE | Freq: Once | INTRAMUSCULAR | Status: DC

## 2021-04-11 MED ORDER — KETOROLAC TROMETHAMINE 30 MG/ML IJ SOLN
INTRAMUSCULAR | Status: DC | PRN
  Administered 2021-04-11: 30 mg via INTRAVENOUS

## 2021-04-11 MED ORDER — ORAL CARE MOUTH RINSE: 15.0000 mL | Freq: Once | OROMUCOSAL | Status: AC

## 2021-04-11 MED ORDER — SOD CITRATE-CITRIC ACID 500-334 MG/5ML PO SOLN
ORAL | Status: AC
  Filled 2021-04-11: qty 15

## 2021-04-11 MED ORDER — NALBUPHINE HCL 10 MG/ML IJ SOLN: 5.0000 mg | INTRAMUSCULAR | Status: DC | PRN

## 2021-04-11 MED ORDER — PHENYLEPHRINE HCL (PRESSORS) 10 MG/ML IV SOLN
INTRAVENOUS | Status: DC | PRN
  Administered 2021-04-11: 100 ug via INTRAVENOUS

## 2021-04-11 MED ORDER — SENNOSIDES-DOCUSATE SODIUM 8.6-50 MG PO TABS
2.0000 | ORAL_TABLET | Freq: Every day | ORAL | Status: DC
  Administered 2021-04-12: 2 via ORAL
  Filled 2021-04-11: qty 2

## 2021-04-11 MED ORDER — ACETAMINOPHEN 500 MG PO TABS
1000.0000 mg | ORAL_TABLET | Freq: Four times a day (QID) | ORAL | Status: DC
  Administered 2021-04-11 – 2021-04-13 (×6): 1000 mg via ORAL
  Filled 2021-04-11 (×6): qty 2

## 2021-04-11 MED ORDER — OXYCODONE HCL 5 MG PO TABS
5.0000 mg | ORAL_TABLET | ORAL | Status: DC | PRN
Start: 2021-04-11 — End: 2021-04-13
  Administered 2021-04-12 – 2021-04-13 (×3): 5 mg via ORAL
  Filled 2021-04-11 (×2): qty 1

## 2021-04-11 MED ORDER — FENTANYL CITRATE (PF) 100 MCG/2ML IJ SOLN: 25.0000 ug | INTRAMUSCULAR | Status: DC | PRN

## 2021-04-11 MED ORDER — KETOROLAC TROMETHAMINE 30 MG/ML IJ SOLN: 30.0000 mg | Freq: Once | INTRAMUSCULAR | Status: DC

## 2021-04-11 MED ORDER — OXYTOCIN-SODIUM CHLORIDE 30-0.9 UT/500ML-% IV SOLN
2.5000 [IU]/h | INTRAVENOUS | Status: AC
  Administered 2021-04-11: 2.5 [IU]/h via INTRAVENOUS

## 2021-04-11 MED ORDER — MEPERIDINE HCL 25 MG/ML IJ SOLN: 6.2500 mg | INTRAMUSCULAR | Status: DC | PRN

## 2021-04-11 MED ORDER — SIMETHICONE 80 MG PO CHEW
80.0000 mg | CHEWABLE_TABLET | Freq: Three times a day (TID) | ORAL | Status: DC
  Administered 2021-04-12 – 2021-04-13 (×4): 80 mg via ORAL
  Filled 2021-04-11 (×5): qty 1

## 2021-04-11 MED ORDER — GABAPENTIN 300 MG PO CAPS
300.0000 mg | ORAL_CAPSULE | Freq: Once | ORAL | Status: AC
  Administered 2021-04-11: 300 mg via ORAL
  Filled 2021-04-11: qty 1

## 2021-04-11 MED ORDER — SODIUM CHLORIDE 0.9 % IV SOLN
INTRAVENOUS | Status: DC | PRN
  Administered 2021-04-11: 40 ug/min via INTRAVENOUS

## 2021-04-11 MED ORDER — NALOXONE HCL 0.4 MG/ML IJ SOLN: 0.4000 mg | INTRAMUSCULAR | Status: DC | PRN

## 2021-04-11 MED ORDER — NALBUPHINE HCL 10 MG/ML IJ SOLN
5.0000 mg | INTRAMUSCULAR | Status: DC | PRN
Start: 2021-04-11 — End: 2021-04-11

## 2021-04-11 MED ORDER — FAMOTIDINE 20 MG PO TABS
20.0000 mg | ORAL_TABLET | Freq: Once | ORAL | Status: AC
  Administered 2021-04-11: 20 mg via ORAL
  Filled 2021-04-11: qty 1

## 2021-04-11 MED ORDER — GABAPENTIN 300 MG PO CAPS
300.0000 mg | ORAL_CAPSULE | Freq: Every day | ORAL | Status: DC
  Administered 2021-04-11 – 2021-04-12 (×2): 300 mg via ORAL
  Filled 2021-04-11 (×2): qty 1

## 2021-04-11 MED ORDER — KETOROLAC TROMETHAMINE 30 MG/ML IJ SOLN: 30.0000 mg | Freq: Four times a day (QID) | INTRAMUSCULAR | Status: DC | PRN

## 2021-04-11 MED ORDER — IBUPROFEN 600 MG PO TABS
600.0000 mg | ORAL_TABLET | Freq: Four times a day (QID) | ORAL | Status: DC
  Administered 2021-04-12 – 2021-04-13 (×3): 600 mg via ORAL
  Filled 2021-04-11 (×3): qty 1

## 2021-04-11 MED ORDER — MORPHINE SULFATE (PF) 0.5 MG/ML IJ SOLN
INTRAMUSCULAR | Status: DC | PRN
  Administered 2021-04-11: 100 ug via EPIDURAL

## 2021-04-11 MED ORDER — KETOROLAC TROMETHAMINE 30 MG/ML IJ SOLN
30.0000 mg | Freq: Four times a day (QID) | INTRAMUSCULAR | Status: AC
Start: 2021-04-11 — End: 2021-04-12
  Administered 2021-04-11 – 2021-04-12 (×4): 30 mg via INTRAVENOUS
  Filled 2021-04-11 (×4): qty 1

## 2021-04-11 MED ORDER — DIBUCAINE (PERIANAL) 1 % EX OINT: 1.0000 "application " | TOPICAL_OINTMENT | CUTANEOUS | Status: DC | PRN

## 2021-04-11 MED ORDER — WITCH HAZEL-GLYCERIN EX PADS: 1.0000 "application " | MEDICATED_PAD | CUTANEOUS | Status: DC | PRN

## 2021-04-11 MED ORDER — PROMETHAZINE HCL 25 MG/ML IJ SOLN: 6.2500 mg | INTRAMUSCULAR | Status: DC | PRN

## 2021-04-11 MED ORDER — BUPIVACAINE LIPOSOME 1.3 % IJ SUSP
INTRAMUSCULAR | Status: DC | PRN
  Administered 2021-04-11: 100 mL

## 2021-04-11 MED ORDER — SODIUM CHLORIDE (PF) 0.9 % IJ SOLN
INTRAMUSCULAR | Status: AC
  Filled 2021-04-11: qty 50

## 2021-04-11 MED ORDER — SODIUM CHLORIDE 0.9% FLUSH: 3.0000 mL | INTRAVENOUS | Status: DC | PRN

## 2021-04-11 MED ORDER — MENTHOL 3 MG MT LOZG
1.0000 | LOZENGE | OROMUCOSAL | Status: DC | PRN
  Filled 2021-04-11: qty 9

## 2021-04-11 MED ORDER — ZOLPIDEM TARTRATE 5 MG PO TABS: 5.0000 mg | ORAL_TABLET | Freq: Every evening | ORAL | Status: DC | PRN

## 2021-04-11 MED ORDER — FENTANYL CITRATE (PF) 100 MCG/2ML IJ SOLN
INTRAMUSCULAR | Status: DC | PRN
  Administered 2021-04-11: 15 ug via INTRATHECAL

## 2021-04-11 MED ORDER — ACETAMINOPHEN 500 MG PO TABS
1000.0000 mg | ORAL_TABLET | Freq: Once | ORAL | Status: AC
  Administered 2021-04-11: 1000 mg via ORAL
  Filled 2021-04-11: qty 2

## 2021-04-11 MED ORDER — BUPIVACAINE LIPOSOME 1.3 % IJ SUSP
INTRAMUSCULAR | Status: AC
  Filled 2021-04-11: qty 20

## 2021-04-11 MED ORDER — ONDANSETRON HCL 4 MG/2ML IJ SOLN
INTRAMUSCULAR | Status: AC
  Filled 2021-04-11: qty 2

## 2021-04-11 MED ORDER — COCONUT OIL OIL
1.0000 "application " | TOPICAL_OIL | Status: DC | PRN
  Administered 2021-04-11: 1 via TOPICAL
  Filled 2021-04-11: qty 120

## 2021-04-11 MED ORDER — DIPHENHYDRAMINE HCL 25 MG PO CAPS: 25.0000 mg | ORAL_CAPSULE | Freq: Four times a day (QID) | ORAL | Status: DC | PRN

## 2021-04-11 MED ORDER — PRENATAL MULTIVITAMIN CH
1.0000 | ORAL_TABLET | Freq: Every day | ORAL | Status: DC
  Administered 2021-04-12: 1 via ORAL
  Filled 2021-04-11: qty 1

## 2021-04-11 MED ORDER — OXYCODONE HCL 5 MG PO TABS
5.0000 mg | ORAL_TABLET | ORAL | Status: DC | PRN
  Administered 2021-04-11: 5 mg via ORAL
  Filled 2021-04-11 (×2): qty 1

## 2021-04-11 SURGICAL SUPPLY — 28 items
BACTOSHIELD CHG 4% 4OZ (MISCELLANEOUS) ×1
BARRIER ADHS 3X4 INTERCEED (GAUZE/BANDAGES/DRESSINGS) ×2 IMPLANT
CHLORAPREP W/TINT 26 (MISCELLANEOUS) ×2 IMPLANT
DRESSING TELFA 8X3 (GAUZE/BANDAGES/DRESSINGS) ×2 IMPLANT
DRSG TELFA 3X8 NADH (GAUZE/BANDAGES/DRESSINGS) ×2 IMPLANT
ELECT CAUTERY BLADE 6.4 (BLADE) ×2 IMPLANT
ELECT REM PT RETURN 9FT ADLT (ELECTROSURGICAL) ×2
ELECTRODE REM PT RTRN 9FT ADLT (ELECTROSURGICAL) ×1 IMPLANT
GAUZE SPONGE 4X4 12PLY STRL (GAUZE/BANDAGES/DRESSINGS) ×2 IMPLANT
GLOVE SURG SYN 8.0 (GLOVE) ×2 IMPLANT
GOWN STRL REUS W/ TWL LRG LVL3 (GOWN DISPOSABLE) ×2 IMPLANT
GOWN STRL REUS W/ TWL XL LVL3 (GOWN DISPOSABLE) ×1 IMPLANT
GOWN STRL REUS W/TWL LRG LVL3 (GOWN DISPOSABLE) ×2
GOWN STRL REUS W/TWL XL LVL3 (GOWN DISPOSABLE) ×1
MANIFOLD NEPTUNE II (INSTRUMENTS) ×2 IMPLANT
MAT PREVALON FULL STRYKER (MISCELLANEOUS) ×2 IMPLANT
NEEDLE HYPO 22GX1.5 SAFETY (NEEDLE) ×2 IMPLANT
NS IRRIG 1000ML POUR BTL (IV SOLUTION) ×2 IMPLANT
PACK C SECTION AR (MISCELLANEOUS) ×2 IMPLANT
PAD OB MATERNITY 4.3X12.25 (PERSONAL CARE ITEMS) ×2 IMPLANT
PAD PREP 24X41 OB/GYN DISP (PERSONAL CARE ITEMS) ×2 IMPLANT
SCRUB CHG 4% DYNA-HEX 4OZ (MISCELLANEOUS) ×1 IMPLANT
STRAP SAFETY 5IN WIDE (MISCELLANEOUS) ×2 IMPLANT
SUCT VACUUM KIWI BELL (SUCTIONS) ×2 IMPLANT
SUT CHROMIC 1 CTX 36 (SUTURE) ×6 IMPLANT
SUT PLAIN GUT 0 (SUTURE) ×4 IMPLANT
SUT VIC AB 0 CT1 36 (SUTURE) ×4 IMPLANT
SYR 30ML LL (SYRINGE) ×4 IMPLANT

## 2021-04-11 NOTE — Progress Notes (Signed)
Patient ID: Dawn Schaefer, female   DOB: December 31, 2000, 20 y.o.   MRN: [id number]   Pt is ready for repeat cesarean section because of a prior classical c/s  LAbs reviewed . All questions answered . Proceed

## 2021-04-11 NOTE — Discharge Summary (Signed)
Obstetrical Discharge Summary  Patient Name: Dawn Schaefer DOB: 2001/01/14 MRN: [id number]  Date of Admission: 04/11/2021 Date of Delivery: 04/11/21 Delivered by: Huel Cote MD Date of Discharge: 04/13/21  Primary OB: Vaughn  [facility] last menstrual period was 07/30/2020 (exact date). EDC Estimated Date of Delivery: 05/06/21 Gestational Age at Delivery: [redacted]w[redacted]d   Antepartum complications: prior classical cesarean for PPROM 24 weeks  Admitting Diagnosis:  Secondary Diagnosis: Patient Active Problem List   Diagnosis Date Noted   Previous cesarean delivery affecting pregnancy 04/11/2021   Postoperative state 04/11/2021   Decreased fetal movement 01/20/2021   Late prenatal care 14 wks 12/31/2020   History of marijuana use; on probation with court date 12/27/20 12/31/2020   UTI (urinary tract infection) during pregnancy 11/05/20 >100,000 E. coli 11/09/2020   Supervision of high risk pregnancy, antepartum 11/05/2020   History of Classical cesarean delivery, currently pregnant 11/05/2020   H/O preterm delivery, currently pregnant 11/05/2020    Augmentation: N/A Complications: None Intrapartum complications/course:  Date of Delivery: 04/11/21 Delivered By: Huel Cote MD Delivery Type: repeat cesarean section, low transverse incision Anesthesia: spinal Placenta: manual Laceration: none Episiotomy: none  Newborn Data: Live born child female  Birth Weight:  5#7oz APGAR: 8, 9  Newborn Delivery   Birth date/time:  Delivery type:         Postpartum Procedures: none   Edinburgh:  Edinburgh Postnatal Depression Scale Screening Tool 04/11/2021 04/11/2021  I have been able to laugh and see the funny side of things. 0 (No Data)  I have looked forward with enjoyment to things. 0 -  I have blamed myself unnecessarily when things went wrong. 3 -  I have been anxious or worried for no good reason. 1 -  I have felt scared or panicky for no good reason. 1 -   Things have been getting on top of me. 1 -  I have been so unhappy that I have had difficulty sleeping. 2 -  I have felt sad or miserable. 1 -  I have been so unhappy that I have been crying. 1 -  The thought of harming myself has occurred to me. 0 -  Edinburgh Postnatal Depression Scale Total 10 -     Cesarean Section:  Patient had an uncomplicated postpartum course.  By time of discharge on POD#2, her pain was controlled on oral pain medications; she had appropriate lochia and was ambulating, voiding without difficulty, tolerating regular diet and passing flatus.   She was deemed stable for discharge to home.    Discharge Physical Exam:  BP 114/77 (BP Location: Right Arm)   Pulse 93   Temp 98.5 F (36.9 C) (Oral)   Resp 18   Ht $R'5\' 1"'Zn$  (1.549 m)   Wt 70.8 kg   LMP 07/30/2020 (Exact Date) Comment: normal  SpO2 98%   Breastfeeding Unknown   BMI 29.48 kg/m   General: NAD CV: RRR Pulm: CTABL, nl effort ABD: s/nd/nt, fundus firm and below the umbilicus Lochia: moderate Incision: c/d/i DVT Evaluation: LE non-ttp, no evidence of DVT on exam.  Hemoglobin  Date Value Ref Range Status  04/12/2021 10.5 (L) 12.0 - 15.0 g/dL Final  11/05/2020 13.0 11.1 - 15.9 g/dL Final   HCT  Date Value Ref Range Status  04/12/2021 30.7 (L) 36.0 - 46.0 % Final   Hematocrit  Date Value Ref Range Status  11/05/2020 37.9 34.0 - 46.6 % Final     Disposition: stable, discharge to home. Baby Feeding: breastmilk and  formula Baby Disposition: home with mom  Rh Immune globulin given: n/a Rubella vaccine given: MMR x 2 Tdap vaccine given in AP or PP setting: 02/13/21 Flu vaccine given in AP or PP setting: 11/05/20  Contraception: OCPs  Prenatal Labs:   ABO, Rh: O/Positive/-- (02/28 1515) Antibody: Negative (02/28 1515) Rubella:  MMRx2, Varicella : varivax x 2 RPR: Non Reactive (06/08 1130)  HBsAg: Negative (02/28 1515)  HIV:   neg GBS:   not done   Plan:  Dawn Schaefer was  discharged to home in good condition. Follow-up appointment with delivering provider in 2 weeks.  Discharge Medications: Allergies as of 04/13/2021   No Known Allergies      Medication List     STOP taking these medications    Makena autoinjector Generic drug: HYDROXYprogesterone caproate       TAKE these medications    acetaminophen 500 MG tablet Commonly known as: TYLENOL Take 2 tablets (1,000 mg total) by mouth every 6 (six) hours.   coconut oil Oil Apply 1 application topically as needed.   diphenhydrAMINE 25 mg capsule Commonly known as: BENADRYL Take 1 capsule (25 mg total) by mouth every 6 (six) hours as needed for itching.   ibuprofen 600 MG tablet Commonly known as: ADVIL Take 1 tablet (600 mg total) by mouth every 6 (six) hours.   multivitamin-prenatal 27-0.8 MG Tabs tablet Take 1 tablet by mouth daily at 12 noon.   oxyCODONE 5 MG immediate release tablet Commonly known as: Oxy IR/ROXICODONE Take 1 tablet (5 mg total) by mouth every 6 (six) hours as needed for up to 7 days for breakthrough pain or severe pain.   senna-docusate 8.6-50 MG tablet Commonly known as: Senokot-S Take 2 tablets by mouth daily.   simethicone 80 MG chewable tablet Commonly known as: MYLICON Chew 1 tablet (80 mg total) by mouth as needed for flatulence.         Follow-up Information     Schermerhorn, Gwen Her, MD Follow up in 2 week(s).   Specialty: Obstetrics and Gynecology Why: postop check-up Contact information: [address] [place] [address] [phone]                 Signed: Francetta Found, CNM 04/13/2021 8:00 AM

## 2021-04-11 NOTE — Transfer of Care (Signed)
Immediate Anesthesia Transfer of Care Note  Patient: Dawn Schaefer  Procedure(s) Performed: REPEAT CESAREAN SECTION  Patient Location: PACU and LDR3  Anesthesia Type:Spinal  Level of Consciousness: awake and patient cooperative  Airway & Oxygen Therapy: Patient Spontanous Breathing  Post-op Assessment: Report given to RN and Post -op Vital signs reviewed and stable  Post vital signs: Reviewed and stable  Last Vitals:  Vitals Value Taken Time  BP    Temp 36.4 C 04/11/21 1116  Pulse    Resp    SpO2      Last Pain:  Vitals:   04/11/21 1116  TempSrc: Oral  PainSc:          Complications: No notable events documented.

## 2021-04-11 NOTE — Discharge Instructions (Signed)
Discharge Instructions:  ° °Follow-up Appointment:  ° °If there are any new medications, they have been ordered and will be available for pickup at the listed pharmacy on your way home from the hospital.  ° °Call office if you have any of the following: headache, visual changes, fever >101.0 F, chills, shortness of breath, breast concerns, excessive vaginal bleeding, incision drainage or problems, leg pain or redness, depression or any other concerns. If you have vaginal discharge with an odor, let your doctor know.  ° °It is normal to bleed for up to 6 weeks. You should not soak through more than 1 pad in 1 hour. If you have a blood clot larger than your fist with continued bleeding, call your doctor.  ° °After a c-section, you should expect a small amount of blood or clear fluid coming from the incision and abdominal cramping/soreness. Inspect your incision site daily. Stand in front of a mirror to look for any redness, incision opening, or discolored/odorness drainage. Take a shower daily and continue good hygiene. Use own towel and washcloth (do not share). Make sure your sheets on your bed are clean. No pets sleeping around your incision site. Dressing will be removed at your postpartum visit. If the dressing does become wet or soiled underneath, it is okay to remove it.  ° °Activity: Do not lift > 10 lbs for 6 weeks (do not lift anything heavier than your baby). °No intercourse, tampons, swimming pools, hot tubs, baths (only showers) for 6 weeks.  °No driving for 1-2 weeks. °Continue prenatal vitamin, especially if breastfeeding. °Increase calories and fluids (water) while breastfeeding.  ° °Your milk will come in, in the next couple of days (right now it is colostrum). You may have a slight fever when your milk comes in, but it should go away on its own.  If it does not, and rises above 101 F please call the doctor. You will also feel achy and your breasts will be firm. They will also start to leak. If you  are breastfeeding, continue as you have been and you can pump/express milk for comfort.  ° °If you have too much milk, your breasts can become engorged, which could lead to mastitis. This is an infection of the milk ducts. It can be very painful and you will need to notify your doctor to obtain a prescription for antibiotics. You can also treat it with a shower or hot/cold compress.  ° °For concerns about your baby, please call your pediatrician.  °For breastfeeding concerns, the lactation consultant can be reached at [phone].  ° °Postpartum blues (feelings of happy one minute and sad another minute) are normal for the first few weeks but if it gets worse let your doctor know.  ° °Congratulations! We enjoyed caring for you and your new bundle of joy!  °

## 2021-04-11 NOTE — Anesthesia Preprocedure Evaluation (Signed)
Anesthesia Evaluation  Patient identified by MRN, date of birth, ID band Patient awake    Reviewed: Allergy & Precautions, H&P , NPO status , Patient's Chart, lab work & pertinent test results, reviewed documented beta blocker date and time   History of Anesthesia Complications Negative for: history of anesthetic complications  Airway Mallampati: III  TM Distance: >3 FB Neck ROM: full    Dental  (+) Dental Advidsory Given, Teeth Intact   Pulmonary neg shortness of breath, asthma , neg recent URI, former smoker,    Pulmonary exam normal breath sounds clear to auscultation       Cardiovascular Exercise Tolerance: Good negative cardio ROS Normal cardiovascular exam Rhythm:regular Rate:Normal     Neuro/Psych negative neurological ROS  negative psych ROS   GI/Hepatic Neg liver ROS, GERD  ,  Endo/Other  negative endocrine ROS  Renal/GU negative Renal ROS  negative genitourinary   Musculoskeletal   Abdominal   Peds  Hematology negative hematology ROS (+)   Anesthesia Other Findings Past Medical History: No date: History of threatened premature labor No date: Nausea and vomiting No date: Patient denies medical problems No date: Preterm labor   Reproductive/Obstetrics (+) Pregnancy                             Anesthesia Physical Anesthesia Plan  ASA: 2  Anesthesia Plan: Spinal   Post-op Pain Management:    Induction:   PONV Risk Score and Plan:   Airway Management Planned: Natural Airway  Additional Equipment:   Intra-op Plan:   Post-operative Plan:   Informed Consent: I have reviewed the patients History and Physical, chart, labs and discussed the procedure including the risks, benefits and alternatives for the proposed anesthesia with the patient or authorized representative who has indicated his/her understanding and acceptance.     Dental Advisory Given  Plan Discussed  with: Anesthesiologist, CRNA and Surgeon  Anesthesia Plan Comments:         Anesthesia Quick Evaluation

## 2021-04-11 NOTE — Anesthesia Procedure Notes (Signed)
Spinal  Patient location during procedure: OR Start time: 04/11/2021 10:14 AM End time: 04/11/2021 10:25 AM Reason for block: surgical anesthesia Staffing Performed: other anesthesia staff  Anesthesiologist: Martha Clan, MD Resident/CRNA: Aline Brochure, CRNA Preanesthetic Checklist Completed: patient identified, IV checked, site marked, risks and benefits discussed, surgical consent, monitors and equipment checked, pre-op evaluation and timeout performed Spinal Block Patient position: sitting Prep: ChloraPrep and site prepped and draped Patient monitoring: heart rate, continuous pulse ox, blood pressure and cardiac monitor Approach: midline Location: L4-5 Injection technique: single-shot Needle Needle type: Introducer and Pencan  Needle gauge: 24 G Needle length: 9 cm Assessment Events: CSF return Additional Notes Negative paresthesia. Negative blood return. Positive free-flowing CSF. Expiration date of kit checked and confirmed. Patient tolerated procedure well, without complications.

## 2021-04-11 NOTE — Lactation Note (Signed)
This note was copied from a baby's chart. Lactation Consultation Note  Patient Name: Dawn Schaefer DEYCX'K Date: 04/11/2021 Reason for consult: Initial assessment;1st time breastfeeding;Late-preterm 34-36.6wks;Other (Comment) (c-section) Age:20 hours  Initial lactation visit in LDR3. This is mom's first baby (first pregnancy was 24w c-section; fetal demise).  Dawn Schaefer is [redacted]w[redacted]d born via c-section. Birth weight of 2460g, first glucose pending first feeding completion.  Baby awake/eager, grandmother holding baby. LC spoke with mom about feeding plan of breast/formula. First feeding to be at the breast, mom desires options for formula feeding.  Baby unwrapped and brought to mom for football positioning (due to c-section) on the R breast. Several attempts made at latching before baby able to sustain latch. Mom has everted nipples, compressible tissue, and colostrum that is easily expressed through hand expression. Once latched baby had rhythmic sucking pattern and occasional swallows. Baby unlatched after 8 minutes, falling asleep. Baby swaddled and given to grandmother.  LC provided breastfeeding basic education: newborn stomach size, feeding patterns and behaviors, early hunger cues. Discussed impact that formula feedings without breast stimulation can have on establishment of breastfeeding and building an adequate milk production. Mom verbalizes understanding- still considering what her feeding plan will look like. LC reassured mom that she would be supported in her path.  Transition RN updated.  Maternal Data Has patient been taught Hand Expression?: Yes Does the patient have breastfeeding experience prior to this delivery?: No  Feeding Mother's Current Feeding Choice: Breast Milk and Formula  LATCH Score Latch: Repeated attempts needed to sustain latch, nipple held in mouth throughout feeding, stimulation needed to elicit sucking reflex.  Audible Swallowing: A few with  stimulation  Type of Nipple: Everted at rest and after stimulation  Comfort (Breast/Nipple): Soft / non-tender  Hold (Positioning): Assistance needed to correctly position infant at breast and maintain latch.  LATCH Score: 7   Lactation Tools Discussed/Used    Interventions Interventions: Breast feeding basics reviewed;Assisted with latch;Hand express;Breast compression;Adjust position;Support pillows;Position options;Education  Discharge    Consult Status Consult Status: Follow-up Date: 04/11/21 Follow-up type: In-patient    Danford Bad 04/11/2021, 12:08 PM

## 2021-04-11 NOTE — Op Note (Signed)
Dawn Schaefer, [place] MEDICAL RECORD NO: [id number] ACCOUNT NO: [id number] DATE OF BIRTH: Feb 19, 2001 FACILITY: [facility] LOCATION: [facility] PHYSICIAN: Suzy Bouchard, MD  Operative Report   DATE OF PROCEDURE: 04/11/2021  PREOPERATIVE DIAGNOSIS: 1.  Prior classical cesarean section. 2  36+3 weeks estimated gestational age.  POSTOPERATIVE DIAGNOSES: 1.  Prior classical cesarean section. 2  36+3 weeks estimated gestational age. 3.  Viable female, delivered.  PROCEDURE:  Repeat low transverse cesarean section.  ANESTHESIA:  Spinal.  SURGEON:  Suzy Bouchard, MD  FIRST ASSISTANT:  Haroldine Laws, certified nurse midwife.  INDICATIONS:  This is a 20 year old gravida 2, para 1, patient with a history of a prior classical cesarean section secondary to premature preterm rupture of membranes at 24 weeks.  DESCRIPTION OF PROCEDURE:  After adequate spinal anesthesia, the patient was placed in dorsal supine position with a hip roll under the right side.  The patient's abdomen was prepped and draped in normal sterile fashion.  The patient did receive 2 grams  of IV Ancef prior to commencement of the case.  Timeout was performed.  A Pfannenstiel incision was made 2 fingerbreadths above the symphysis pubis.  Sharp dissection was used to identify the fascia.  Fascia was opened in the midline and opened in a  transverse fashion.  The superior aspect of the fascia was grasped with Kocher clamps and rectus muscles were dissected free.  The inferior aspect of the fascia was grasped with Kocher clamps and pyramidalis muscle was dissected free.  Entry into the  peritoneal cavity was accomplished sharply.  The vesicouterine peritoneal fold was identified and a bladder flap was created and the bladder was reflected inferiorly.  Low transverse uterine incision was made.  Upon entry into the endometrial cavity  clear fluid resulted.  The incision was extended with blunt transverse traction.   Fetal head was brought to the incision and a Kiwi vacuum was applied to the occiput and with one gentle pull, the head was delivered and the vacuum was removed.  A loose  nuchal and body cord was reduced and the vigorous female was delivered without complication and dried on the mother's abdomen for 30 seconds.  Neonatologist then accepted the baby.  Time of birth was 10:40 on 04/11/2021.  Fetal weight 5 pounds 7 ounces.   The placenta was then manually delivered and the uterus was exteriorized.  The endometrial cavity was wiped clean with a laparotomy tape.  Fallopian tubes and ovaries appeared normal.  The uterine incision was closed with #1 chromic suture in a running  locking fashion.  Good approximation of edges.  Posterior cul-de-sac was irrigated and suctioned and the uterus was placed back into the abdominal cavity and the pericolic gutters were wiped clean with laparotomy tape.  Uterine incision again appeared  hemostatic and Interceed was placed over the uterine incision in a T-shape fashion.  The fascia was then closed with 0 Vicryl suture in a running nonlocking fashion with good approximation of edges.  Subcutaneous tissues were then injected with an  Exparel solution of 20 mL of 1.3% Exparel plus 30 mL of 0.5% Marcaine plus 50 mL normal saline.  40 mL of the solution was injected subfascially.  Subcutaneous tissues were irrigated and bovied for hemostasis and the skin was reapproximated with Insorb  absorbable staples.  Good cosmetic effect.  There were no complications.  The patient tolerated the procedure well.  ESTIMATED BLOOD LOSS:  400 mL  INTRAOPERATIVE FLUIDS:  700 mL  URINE OUTPUT:  75 mL.  The patient was taken to recovery room in good condition.   PUS D: 04/11/2021 11:05:44 am T: 04/11/2021 11:45:00 am  JOB: [id number]/ [id number]

## 2021-04-12 LAB — CBC
HCT: 30.7 % — ABNORMAL LOW (ref 36.0–46.0)
Hemoglobin: 10.5 g/dL — ABNORMAL LOW (ref 12.0–15.0)
MCH: 31.9 pg (ref 26.0–34.0)
MCHC: 34.2 g/dL (ref 30.0–36.0)
MCV: 93.3 fL (ref 80.0–100.0)
Platelets: 173 10*3/uL (ref 150–400)
RBC: 3.29 MIL/uL — ABNORMAL LOW (ref 3.87–5.11)
RDW: 13.4 % (ref 11.5–15.5)
WBC: 14.1 10*3/uL — ABNORMAL HIGH (ref 4.0–10.5)
nRBC: 0 % (ref 0.0–0.2)

## 2021-04-12 LAB — CULTURE, BETA STREP (GROUP B ONLY): Strep Gp B Culture: NEGATIVE

## 2021-04-12 NOTE — Progress Notes (Signed)
Post Partum Day 1 Subjective: Doing well, no complaints.  Tolerating regular diet, pain with PO meds, voiding and ambulating without difficulty.  No CP SOB Fever,Chills, N/V or leg pain; denies nipple or breast pain, no HA change of vision, RUQ/epigastric pain  Objective: BP (!) 107/59 (BP Location: Left Arm)   Pulse 96   Temp 98.5 F (36.9 C) (Oral)   Resp 16   Ht 5\' 1"  (1.549 m)   Wt 70.8 kg   LMP 07/30/2020 (Exact Date) Comment: normal  SpO2 98%   Breastfeeding Unknown   BMI 29.48 kg/m    Physical Exam:  General: NAD Breasts: soft/nontender CV: RRR Pulm: nl effort, CTABL Abdomen: soft, NT, BS x 4 Incision: Dsg CDI/, drainage marked, will change to honeycomb today.  Lochia: small Uterine Fundus: fundus firm and 1 fb below umbilicus DVT Evaluation: no cords, ttp LEs   Recent Labs    04/09/21 0923 04/12/21 0419  HGB 12.1 10.5*  HCT 34.3* 30.7*  WBC 9.4 14.1*  PLT 200 173    Assessment/Plan: 20 y.o. G2P0201 postpartum day # 1  - Continue routine PP care - Lactation consultprn - Discussed contraceptive options including implant, IUDs hormonal and non-hormonal, injection, pills/ring/patch, condoms, and NFP.  - Acute blood loss anemia - hemodynamically stable and asymptomatic; start po ferrous sulfate BID with stool softeners  - Immunization status: all Imms up to date    Disposition: Doesnot desire Dc home today.     06/12/21, CNM 04/12/2021  8:45 AM

## 2021-04-12 NOTE — Anesthesia Postprocedure Evaluation (Signed)
Anesthesia Post Note  Patient: Dawn Schaefer  Procedure(s) Performed: REPEAT CESAREAN SECTION  Patient location during evaluation: Mother Baby Anesthesia Type: Spinal Level of consciousness: oriented and awake and alert Pain management: pain level controlled Vital Signs Assessment: post-procedure vital signs reviewed and stable Respiratory status: spontaneous breathing and respiratory function stable Cardiovascular status: blood pressure returned to baseline and stable Postop Assessment: no headache, no backache, no apparent nausea or vomiting and able to ambulate Anesthetic complications: no   No notable events documented.   Last Vitals:  Vitals:   04/11/21 2244 04/12/21 0401  BP: 104/62 (!) 107/59  Pulse: 79 96  Resp: 17 16  Temp: 36.9 C 36.9 C  SpO2: 99% 98%    Last Pain:  Vitals:   04/12/21 0500  TempSrc:   PainSc: 5                  Starling Manns

## 2021-04-12 NOTE — TOC Initial Note (Signed)
Transition of Care [facility]) - Initial/Assessment Note    Patient Details  Name: Dawn Schaefer MRN: [id number] Date of Birth: [birth year]  Transition of Care [facility]) CM/SW Contact:    Hetty Ely, RN Phone Number: 04/12/2021, 4:25 PM  Clinical Narrative:  Spoke with patient, mother Duanne Limerick at bedside both smiling, voices excitement about caring for baby. Baby name Buck Mam. MOB says she has everything needed for newborn, she also have assigned caseworker and will be going to [facility] to sign up for services on Monday. Patient lives with Mom and two siblings, ages 50 and 110. Discussed PPD and advised to seek help if symptoms occurs, also given resource list with PPD providers in the area. Patient and Mom voices understanding. No TOC barriers assess, patient can discharge when medically stable.                       Patient Goals and CMS Choice        Expected Discharge Plan and Services                                                Prior Living Arrangements/Services                       Activities of Daily Living Home Assistive Devices/Equipment: None ADL Screening (condition at time of admission) Patient's cognitive ability adequate to safely complete daily activities?: Yes Is the patient deaf or have difficulty hearing?: No Does the patient have difficulty seeing, even when wearing glasses/contacts?: No Does the patient have difficulty concentrating, remembering, or making decisions?: No Patient able to express need for assistance with ADLs?: Yes Does the patient have difficulty dressing or bathing?: No Independently performs ADLs?: Yes (appropriate for developmental age) Does the patient have difficulty walking or climbing stairs?: No Weakness of Legs: None Weakness of Arms/Hands: None  Permission Sought/Granted                  Emotional Assessment              Admission diagnosis:  Previous cesarean  delivery affecting pregnancy [O34.219] Postoperative state [Z98.890] Patient Active Problem List   Diagnosis Date Noted   Previous cesarean delivery affecting pregnancy 04/11/2021   Postoperative state 04/11/2021   Decreased fetal movement 01/20/2021   Late prenatal care 14 wks 12/31/2020   History of marijuana use; on probation with court date 12/27/20 12/31/2020   UTI (urinary tract infection) during pregnancy 11/05/20 >100,000 E. coli 11/09/2020   Supervision of high risk pregnancy, antepartum 11/05/2020   History of Classical cesarean delivery, currently pregnant 11/05/2020   H/O preterm delivery, currently pregnant 11/05/2020   PCP:  Corky Downs, NP Pharmacy:   [facility] [phone] Nicholes Rough, [place] - [address] AT [facility] [address] [place] [address] Phone: [phone] Fax: [phone]     Social Determinants of Health (SDOH) Interventions    Readmission Risk Interventions No flowsheet data found.

## 2021-04-12 NOTE — Anesthesia Post-op Follow-up Note (Signed)
  Anesthesia Pain Follow-up Note  Patient: TIFFANYANN DEROO  Day #: 1  Date of Follow-up: 04/12/2021 Time: 7:27 AM  Last Vitals:  Vitals:   04/11/21 2244 04/12/21 0401  BP: 104/62 (!) 107/59  Pulse: 79 96  Resp: 17 16  Temp: 36.9 C 36.9 C  SpO2: 99% 98%    Level of Consciousness: alert  Pain: none   Side Effects:None  Catheter Site Exam:clean, dry, no drainage  Anti-Coag Meds (From admission, onward)   Start     Dose/Rate Route Frequency Ordered Stop   04/12/21 0900  enoxaparin (LOVENOX) injection 40 mg        40 mg Subcutaneous Every 24 hours 04/11/21 1536         Plan: D/C from anesthesia care at surgeon's request  Starling Manns

## 2021-04-12 NOTE — Lactation Note (Signed)
This note was copied from a baby's chart. Lactation Consultation Note  Patient Name: Dawn Schaefer [id number] Date: 04/12/2021 Reason for consult: Initial assessment;Primapara;Late-preterm 34-36.6wks;Infant < 6lbs Age:20 hours  Maternal Data Has patient been taught Hand Expression?: Yes Does the patient have breastfeeding experience prior to this delivery?: Yes How long did the patient breastfeed?: 24 wk fetal demise, pumped breasts to decrease engorgment after delivery  Feeding Mother's Current Feeding Choice: Breast Milk and Formula, mom had been attempting breastfeeding but had not been observed and giving formula after attempt, requested manual breast pump, harmony pump given and mom prepumped on left to soften areola and baby latched well, nursed 15 min with small amt colostrum given at breast with curved tip syringe, latched to right breast after prepumping to soften, latched and nursed well with shaping breast and support x 10 min, 3 cc pumped colostrum given with curved tip syringe at breast.  Encouraged mom to offer breast ever 2 hrs, with the same protocol above, if poor feed pump and given EBM by syringe or bottle with slow flow nipple, may supp with formula if not enough EBM, call for assistance as needed from nursing  [facility] Score Latch: Grasps breast easily, tongue down, lips flanged, rhythmical sucking.  Audible Swallowing: Spontaneous and intermittent  Type of Nipple: Everted at rest and after stimulation (after manual pump)  Comfort (Breast/Nipple): Filling, red/small blisters or bruises, mild/mod discomfort (filling)  Hold (Positioning): Assistance needed to correctly position infant at breast and maintain latch.  LATCH Score: 8   Lactation Tools Discussed/Used  LC name and no written on white board  Interventions Interventions: Breast feeding basics reviewed;Assisted with latch;Skin to skin;Breast massage;Hand express;Pre-pump if needed;Adjust  position;Support pillows;Hand pump;Education  Discharge Pump: Manual (Medela harmony pump given with instructon) WIC Program: Yes  Consult Status Consult Status: PRN Date: 04/12/21 Follow-up type: In-patient    Dyann Kief 04/12/2021, 5:39 PM

## 2021-04-13 LAB — CHLAMYDIA/GC NAA, CONFIRMATION
Chlamydia trachomatis, NAA: POSITIVE — AB
Neisseria gonorrhoeae, NAA: NEGATIVE

## 2021-04-13 LAB — C. TRACHOMATIS NAA, CONFIRM: C. trachomatis NAA, Confirm: POSITIVE — AB

## 2021-04-13 MED ORDER — DIPHENHYDRAMINE HCL 25 MG PO CAPS: 25.0000 mg | ORAL_CAPSULE | Freq: Four times a day (QID) | ORAL | 0 refills | Status: DC | PRN

## 2021-04-13 MED ORDER — OXYCODONE HCL 5 MG PO TABS: 5.0000 mg | ORAL_TABLET | Freq: Four times a day (QID) | ORAL | 0 refills | Status: AC | PRN

## 2021-04-13 MED ORDER — COCONUT OIL OIL: 1.0000 "application " | TOPICAL_OIL | 0 refills | Status: AC | PRN

## 2021-04-13 MED ORDER — SENNOSIDES-DOCUSATE SODIUM 8.6-50 MG PO TABS: 2.0000 | ORAL_TABLET | Freq: Every day | ORAL | 0 refills | Status: AC

## 2021-04-13 MED ORDER — IBUPROFEN 600 MG PO TABS: 600.0000 mg | ORAL_TABLET | Freq: Four times a day (QID) | ORAL | 0 refills | Status: AC

## 2021-04-13 MED ORDER — ACETAMINOPHEN 500 MG PO TABS: 1000.0000 mg | ORAL_TABLET | Freq: Four times a day (QID) | ORAL | 0 refills | Status: AC

## 2021-04-13 MED ORDER — SIMETHICONE 80 MG PO CHEW: 80.0000 mg | CHEWABLE_TABLET | ORAL | 0 refills | Status: AC | PRN

## 2021-04-13 NOTE — Progress Notes (Signed)
Discussed discharge instructions with patient. Verbalized understanding

## 2021-04-13 NOTE — Progress Notes (Signed)
Post Partum Day 2 Subjective: Doing well, no complaints.  Tolerating regular diet, pain with PO meds, voiding and ambulating without difficulty.  No CP SOB Fever,Chills, N/V or leg pain; denies nipple or breast pain, no HA change of vision, RUQ/epigastric pain  Objective: BP 114/77 (BP Location: Right Arm)   Pulse 93   Temp 98.5 F (36.9 C) (Oral)   Resp 18   Ht 5\' 1"  (1.549 m)   Wt 70.8 kg   LMP 07/30/2020 (Exact Date) Comment: normal  SpO2 98%   Breastfeeding Unknown   BMI 29.48 kg/m    Physical Exam:  General: NAD Breasts: soft/nontender CV: RRR Pulm: nl effort, CTABL Abdomen: soft, NT, BS x 4 Incision: Honeycomb dsg intact, no drainage or erythema noted.  Lochia: small Uterine Fundus: fundus firm and 1 fb below umbilicus DVT Evaluation: no cords, ttp LEs   Recent Labs    04/12/21 0419  HGB 10.5*  HCT 30.7*  WBC 14.1*  PLT 173    Assessment/Plan: 20 y.o. G2P0201 postpartum day # 2  - Continue routine PP care - Lactation consultprn - plans to use OCPs for birth control.  - Acute blood loss anemia - hemodynamically stable and asymptomatic; continue po ferrous sulfate BID with stool softeners  - Immunization status: all Imms up to date    Disposition: Does desire Dc home today.     06/12/21, CNM 04/13/2021  7:58 AM

## 2021-04-14 LAB — BPAM RBC
Blood Product Expiration Date: 202209072359
Blood Product Expiration Date: 202209102359
Unit Type and Rh: 5100
Unit Type and Rh: 5100

## 2021-04-14 LAB — TYPE AND SCREEN
ABO/RH(D): O POS
Antibody Screen: POSITIVE
Extend sample reason: UNDETERMINED
Unit division: 0
Unit division: 0

## 2021-04-15 ENCOUNTER — Telehealth: Payer: Self-pay

## 2021-04-15 NOTE — Telephone Encounter (Signed)
TC to patient to schedule appointment for STD treatment. Patient scheduled for 04/17/2021, needs treatment for Chlamydia. Patient delivered 04/11/2021.Marland KitchenBurt Knack, RN

## 2021-04-17 ENCOUNTER — Ambulatory Visit: Payer: Self-pay

## 2021-04-17 ENCOUNTER — Other Ambulatory Visit: Payer: Self-pay

## 2021-04-17 ENCOUNTER — Ambulatory Visit: Payer: Medicaid Other | Admitting: Advanced Practice Midwife

## 2021-04-17 DIAGNOSIS — A64 Unspecified sexually transmitted disease: Secondary | ICD-10-CM

## 2021-04-17 MED ORDER — DOXYCYCLINE HYCLATE 100 MG PO TABS: 100.0000 mg | ORAL_TABLET | Freq: Two times a day (BID) | ORAL | 0 refills | Status: AC

## 2021-04-17 NOTE — Progress Notes (Signed)
Here today for PP Chlamydia Tx. Patient delivered 04/11/2021 by C-Section at [facility] with [facility] providers. Per standing orders patient is to be treated with Doxycycline 100 mg BID x 7 days. RN consulted with E. Sciora, CNM due to patient is breastfeeding. Per provider patient is ok to be treated per standing orders. Tawny Hopping, RN

## 2021-05-29 ENCOUNTER — Telehealth: Payer: Self-pay

## 2021-05-29 NOTE — Telephone Encounter (Signed)
TC to patient to schedule PP appointment. Patient states she is going to [facility] for her Depo shot, but doesn't have an appointment yet. She states they will call her to set up appointment next week, and patient has already picked up her Depo at the pharmacy. Patient states she does not have a PP appointment scheduled there, and plans to go just for the depo. Patient scheduled for PP visit at [facility] on 06/03/2021.Marland KitchenBurt Knack, RN

## 2021-06-03 ENCOUNTER — Ambulatory Visit: Payer: Medicaid Other

## 2021-06-04 ENCOUNTER — Telehealth: Payer: Self-pay

## 2021-06-04 NOTE — Telephone Encounter (Signed)
[facility] for pp exam yesterday. Call to client to reschedule and per client, has picked up Depo from the pharmacy to have administered at [facility]. Call to [facility] and per Shanda Bumps, client came for 2 week post-op check and MD ordered her Depo at that time. Client has yet to schedule a 6 week post-partum exam. Call to client and counseled that since Depo ordered by [facility] and she has it at home, to call and schedule 6 week post-partum exam and take Depo with her. Client given [facility] phone number and states she will call for appt now. Jossie Ng, RN

## 2022-04-03 ENCOUNTER — Emergency Department
Admission: EM | Admit: 2022-04-03 | Discharge: 2022-04-03 | Disposition: A | Discharge status: Home / Self Care | Payer: Medicaid Other | Attending: Emergency Medicine | Admitting: Emergency Medicine

## 2022-04-03 ENCOUNTER — Other Ambulatory Visit: Payer: Self-pay

## 2022-04-03 DIAGNOSIS — S71101A Unspecified open wound, right thigh, initial encounter: Secondary | ICD-10-CM | POA: Diagnosis present

## 2022-04-03 DIAGNOSIS — W3400XA Accidental discharge from unspecified firearms or gun, initial encounter: Secondary | ICD-10-CM | POA: Insufficient documentation

## 2022-04-03 DIAGNOSIS — M79604 Pain in right leg: Secondary | ICD-10-CM | POA: Diagnosis not present

## 2022-04-03 LAB — POC URINE PREG, ED: Preg Test, Ur: NEGATIVE

## 2022-04-03 MED ORDER — IBUPROFEN 400 MG PO TABS
400.0000 mg | ORAL_TABLET | Freq: Once | ORAL | Status: AC
  Administered 2022-04-03: 400 mg via ORAL
  Filled 2022-04-03: qty 1

## 2022-04-03 MED ORDER — HYDROCODONE-ACETAMINOPHEN 5-325 MG PO TABS: 1.0000 | ORAL_TABLET | ORAL | 0 refills | Status: AC | PRN
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
## 2024-05-05 ENCOUNTER — Emergency Department: Payer: Self-pay

## 2024-05-05 ENCOUNTER — Emergency Department
Admission: EM | Admit: 2024-05-05 | Discharge: 2024-05-05 | Disposition: A | Discharge status: Home / Self Care | Payer: Self-pay | Attending: Emergency Medicine | Admitting: Emergency Medicine

## 2024-05-05 ENCOUNTER — Other Ambulatory Visit: Payer: Self-pay

## 2024-05-05 ENCOUNTER — Encounter: Payer: Self-pay | Admitting: Emergency Medicine

## 2024-05-05 DIAGNOSIS — O26891 Other specified pregnancy related conditions, first trimester: Secondary | ICD-10-CM | POA: Insufficient documentation

## 2024-05-05 DIAGNOSIS — R059 Cough, unspecified: Secondary | ICD-10-CM | POA: Diagnosis not present

## 2024-05-05 DIAGNOSIS — O99411 Diseases of the circulatory system complicating pregnancy, first trimester: Secondary | ICD-10-CM | POA: Insufficient documentation

## 2024-05-05 DIAGNOSIS — Z3A01 Less than 8 weeks gestation of pregnancy: Secondary | ICD-10-CM | POA: Insufficient documentation

## 2024-05-05 DIAGNOSIS — I259 Chronic ischemic heart disease, unspecified: Secondary | ICD-10-CM | POA: Insufficient documentation

## 2024-05-05 LAB — BASIC METABOLIC PANEL WITH GFR
Anion gap: 17 — ABNORMAL HIGH (ref 5–15)
BUN: 13 mg/dL (ref 6–20)
CO2: 24 mmol/L (ref 22–32)
Calcium: 10 mg/dL (ref 8.9–10.3)
Chloride: 92 mmol/L — ABNORMAL LOW (ref 98–111)
Creatinine, Ser: 0.76 mg/dL (ref 0.44–1.00)
GFR, Estimated: >60 mL/min (ref >60)
Glucose, Bld: 130 mg/dL — ABNORMAL HIGH (ref 70–99)
Potassium: 3 mmol/L — ABNORMAL LOW (ref 3.5–5.1)
Sodium: 133 mmol/L — ABNORMAL LOW (ref 135–145)

## 2024-05-05 LAB — CBC
HCT: 44.3 % (ref 36.0–46.0)
Hemoglobin: 16.3 g/dL — ABNORMAL HIGH (ref 12.0–15.0)
MCH: 30.9 pg (ref 26.0–34.0)
MCHC: 36.8 g/dL — ABNORMAL HIGH (ref 30.0–36.0)
MCV: 83.9 fL (ref 80.0–100.0)
Platelets: 285 K/uL (ref 150–400)
RBC: 5.28 MIL/uL — ABNORMAL HIGH (ref 3.87–5.11)
RDW: 11.7 % (ref 11.5–15.5)
WBC: 11.6 K/uL — ABNORMAL HIGH (ref 4.0–10.5)
nRBC: 0 % (ref 0.0–0.2)

## 2024-05-05 LAB — TROPONIN I (HIGH SENSITIVITY): Troponin I (High Sensitivity): 4 ng/L (ref <18)

## 2024-05-05 LAB — D-DIMER, QUANTITATIVE: D-Dimer, Quant: <0.27 ug{FEU}/mL (ref 0.00–0.50)

## 2024-05-05 LAB — POC URINE PREG, ED: Preg Test, Ur: POSITIVE — AB

## 2024-05-05 MED ORDER — ONDANSETRON HCL 4 MG/2ML IJ SOLN
4.0000 mg | Freq: Once | INTRAMUSCULAR | Status: AC
  Administered 2024-05-05: 4 mg via INTRAVENOUS
  Filled 2024-05-05: qty 2

## 2024-05-05 MED ORDER — ONDANSETRON 4 MG PO TBDP: 4.0000 mg | ORAL_TABLET | Freq: Three times a day (TID) | ORAL | 0 refills | Status: AC | PRN

## 2024-05-05 MED ORDER — ALUM & MAG HYDROXIDE-SIMETH 200-200-20 MG/5ML PO SUSP
30.0000 mL | Freq: Once | ORAL | Status: AC
  Administered 2024-05-05: 30 mL via ORAL
  Filled 2024-05-05: qty 30

## 2024-05-05 MED ORDER — PANTOPRAZOLE SODIUM 40 MG IV SOLR
40.0000 mg | Freq: Once | INTRAVENOUS | Status: AC
  Administered 2024-05-05: 40 mg via INTRAVENOUS
  Filled 2024-05-05: qty 10

## 2024-05-05 MED ORDER — SODIUM CHLORIDE 0.9 % IV SOLN: Freq: Once | INTRAVENOUS | Status: AC

## 2024-05-05 MED ORDER — POTASSIUM CHLORIDE 10 MEQ/100ML IV SOLN
10.0000 meq | Freq: Once | INTRAVENOUS | Status: AC
  Administered 2024-05-05: 10 meq via INTRAVENOUS
  Filled 2024-05-05: qty 100

## 2024-05-05 MED ORDER — LIDOCAINE VISCOUS HCL 2 % MT SOLN
15.0000 mL | Freq: Once | OROMUCOSAL | Status: AC
  Administered 2024-05-05: 15 mL via ORAL
  Filled 2024-05-05: qty 15

## 2024-05-05 NOTE — ED Triage Notes (Addendum)
 Pt complains of productive cough with blood tinged sputum and chest discomfort x 4 days. Also complains of SOB and nausea. Denies fever. Positive pregnancy test 2 weeks ago.

## 2024-05-05 NOTE — ED Provider Notes (Signed)
 [facility] Provider Note    Event Date/Time   First MD Initiated Contact with Patient 05/05/24 1640     (approximate)   History   Cough and Chest Pain   HPI  DIVA LEMBERGER is a 23 y.o. female who reports positive pregnancy test about 2 weeks ago and feels that she should be about 4 to [redacted] weeks pregnant who presents with complaints of central chest discomfort, cough, nausea vomiting.  She reports discomfort started after vomiting, she does report some pleurisy as well.  No calf pain or swelling, no history of DVT or PE     Physical Exam   Triage Vital Signs: ED Triage Vitals  Encounter Vitals Group     BP 05/05/24 1532 109/76     Girls Systolic BP Percentile --      Girls Diastolic BP Percentile --      Boys Systolic BP Percentile --      Boys Diastolic BP Percentile --      Pulse Rate 05/05/24 1532 (!) 135     Resp 05/05/24 1532 20     Temp 05/05/24 1531 98.7 F (37.1 C)     Temp Source 05/05/24 1531 Oral     SpO2 05/05/24 1532 98 %     Weight 05/05/24 1530 54.4 kg (120 lb)     Height --      Head Circumference --      Peak Flow --      Pain Score 05/05/24 1530 9     Pain Loc --      Pain Education --      Exclude from Growth Chart --     Most recent vital signs: Vitals:   05/05/24 1727 05/05/24 1925  BP:  116/83  Pulse:  (!) 103  Resp:  17  Temp:  98.1 F (36.7 C)  SpO2: 98% 100%     General: Awake, no distress.  CV:  Good peripheral perfusion.  Tachycardia, no chest wall tenderness to palpation Resp:  Normal effort.  Abd:  No distention.  Soft, nontender, no gravid appearance, no CVA tenderness Other:  No calf pain or swelling   ED Results / Procedures / Treatments   Labs (all labs ordered are listed, but only abnormal results are displayed) Labs Reviewed  BASIC METABOLIC PANEL WITH GFR - Abnormal; Notable for the following components:      Result Value   Sodium 133 (*)    Potassium 3.0 (*)    Chloride 92  (*)    Glucose, Bld 130 (*)    Anion gap 17 (*)    All other components within normal limits  CBC - Abnormal; Notable for the following components:   WBC 11.6 (*)    RBC 5.28 (*)    Hemoglobin 16.3 (*)    MCHC 36.8 (*)    All other components within normal limits  POC URINE PREG, ED - Abnormal; Notable for the following components:   Preg Test, Ur Positive (*)    All other components within normal limits  D-DIMER, QUANTITATIVE  TROPONIN I (HIGH SENSITIVITY)     EKG   ED ECG REPORT I, Lamar Price, the attending physician, personally viewed and interpreted this ECG.  Date: 05/05/2024  Rhythm: Sinus tachycardia QRS Axis: normal Intervals: normal ST/T Wave abnormalities: normal Narrative Interpretation: no evidence of acute ischemia   RADIOLOGY X-ray without evidence of pneumonia    PROCEDURES:  Critical Care performed:   Procedures  MEDICATIONS ORDERED IN ED: Medications  alum & mag hydroxide-simeth (MAALOX/MYLANTA) 200-200-20 MG/5ML suspension 30 mL (30 mLs Oral Given 05/05/24 1728)    And  lidocaine  (XYLOCAINE ) 2 % viscous mouth solution 15 mL (15 mLs Oral Given 05/05/24 1728)  0.9 %  sodium chloride  infusion (0 mLs Intravenous Stopped 05/05/24 2127)  ondansetron  (ZOFRAN ) injection 4 mg (4 mg Intravenous Given 05/05/24 1911)  pantoprazole  (PROTONIX ) injection 40 mg (40 mg Intravenous Given 05/05/24 1911)  potassium chloride  10 mEq in 100 mL IVPB (0 mEq Intravenous Stopped 05/05/24 2050)     IMPRESSION / MDM / ASSESSMENT AND PLAN / ED COURSE  I reviewed the triage vital signs and the nursing notes. Patient's presentation is most consistent with acute presentation with potential threat to life or bodily function.  Patient presents with chest pain as detailed above, she is significantly tachycardic, differential includes esophagitis, pneumonia, pleurisy, less likely PE, doubt ACS  EKG demonstrates sinus tachycardia, high sensitive troponin is normal  Mild  elevation of white blood cell count, mild hyponatremia and hypokalemia  Will treat with a run of potassium, IV fluids, IV Protonix , send D-dimer and reevaluate.    Clinical Course as of 05/06/24 1249  Thu May 05, 2024  2011 Received signout. Recent positive pregnancy test, LMP 1 month ago. Here for chest pain. Now having chest discomfort. Re-evaluate in one hour, if ok, home [HD]  2204 Patient reexamined she is well-appearing wants to eat.  D-dimer not elevated.  She feels comfortable returning home and following up with OB/GYN [HD]    Clinical Course User Index [HD] Nicholaus Rolland BRAVO, MD     FINAL CLINICAL IMPRESSION(S) / ED DIAGNOSES   Final diagnoses:  Less than [redacted] weeks gestation of pregnancy  Chest pain due to myocardial ischemia, unspecified ischemic chest pain type     Rx / DC Orders   ED Discharge Orders          Ordered    ondansetron  (ZOFRAN -ODT) 4 MG disintegrating tablet  Every 8 hours PRN        05/05/24 1822             Note:  This document was prepared using Dragon voice recognition software and may include unintentional dictation errors.   Arlander Charleston, MD 05/06/24 1249

## 2024-05-05 NOTE — Discharge Instructions (Addendum)
 Start taking a prenatal vitamin.  Stick to bland foods.  Follow-up with OB/GYN as soon as possible.  Return with any acutely worsening symptoms or any other emergency -- RETURN PRECAUTIONS & AFTERCARE: (ENGLISH) RETURN PRECAUTIONS: Return immediately to the emergency department or see/call your doctor if you feel worse, weak or have changes in speech or vision, are short of breath, have fever, vomiting, pain, bleeding or dark stool, trouble urinating or any new issues. Return here or see/call your doctor if not improving as expected for your suspected condition. FOLLOW-UP CARE: Call your doctor and/or any doctors we referred you to for more advice and to make an appointment. Do this today, tomorrow or after the weekend. Some doctors only take PPO insurance so if you have HMO insurance you may want to contact your HMO or your regular doctor for referral to a specialist within your plan. Either way tell the doctor's office that it was a referral from the emergency department so you get the soonest possible appointment.  YOUR TEST RESULTS: Take result reports of any blood or urine tests, imaging tests and EKG's to your doctor and any referral doctor. Have any abnormal tests repeated. Your doctor or a referral doctor can let you know when this should be done. Also make sure your doctor contacts this hospital to get any test results that are not currently available such as cultures or special tests for infection and final imaging reports, which are often not available at the time you leave the ER but which may list additional important findings that are not documented on the preliminary report. BLOOD PRESSURE: If your blood pressure was greater than 120/80 have your blood pressure rechecked within 1 to 2 weeks. MEDICATION SIDE EFFECTS: Do not drive, walk, bike, take the bus, etc. if you have received or are being prescribed any sedating medications such as those for pain or anxiety or certain antihistamines like  Benadryl . If you have been give one of these here get a taxi home or have a friend drive you home. Ask your pharmacist to counsel you on potential side effects of any new medication

## 2024-05-06 NOTE — ED Provider Notes (Signed)
 Clinical Course as of 05/06/24 0035  Thu May 05, 2024  2011 Received signout. Recent positive pregnancy test, LMP 1 month ago. Here for chest pain. Now having chest discomfort. Re-evaluate in one hour, if ok, home [HD]  2204 Patient reexamined she is well-appearing wants to eat.  D-dimer not elevated.  She feels comfortable returning home and following up with OB/GYN [HD]    Clinical Course User Index [HD] Nicholaus Rolland BRAVO, MD      Nicholaus Rolland BRAVO, MD 05/06/24 [phone]
